# Patient Record
Sex: Male | Born: 1950 | Race: White | Hispanic: No | Marital: Married | Smoking: Current every day smoker
Health system: Southern US, Community
[De-identification: ages and names within clinical notes are randomized; demographics above are authoritative.]

## PROBLEM LIST (undated history)

## (undated) DIAGNOSIS — M87052 Idiopathic aseptic necrosis of left femur: Secondary | ICD-10-CM

## (undated) DIAGNOSIS — E785 Hyperlipidemia, unspecified: Secondary | ICD-10-CM

## (undated) DIAGNOSIS — F329 Major depressive disorder, single episode, unspecified: Secondary | ICD-10-CM

## (undated) DIAGNOSIS — F32A Depression, unspecified: Secondary | ICD-10-CM

## (undated) DIAGNOSIS — G4733 Obstructive sleep apnea (adult) (pediatric): Secondary | ICD-10-CM

## (undated) DIAGNOSIS — J3081 Allergic rhinitis due to animal (cat) (dog) hair and dander: Secondary | ICD-10-CM

## (undated) DIAGNOSIS — M87051 Idiopathic aseptic necrosis of right femur: Secondary | ICD-10-CM

## (undated) DIAGNOSIS — J302 Other seasonal allergic rhinitis: Secondary | ICD-10-CM

## (undated) DIAGNOSIS — M1990 Unspecified osteoarthritis, unspecified site: Secondary | ICD-10-CM

## (undated) DIAGNOSIS — I10 Essential (primary) hypertension: Secondary | ICD-10-CM

## (undated) DIAGNOSIS — I4892 Unspecified atrial flutter: Secondary | ICD-10-CM

## (undated) DIAGNOSIS — J449 Chronic obstructive pulmonary disease, unspecified: Secondary | ICD-10-CM

## (undated) DIAGNOSIS — K219 Gastro-esophageal reflux disease without esophagitis: Secondary | ICD-10-CM

## (undated) HISTORY — DX: Obstructive sleep apnea (adult) (pediatric): G47.33

## (undated) HISTORY — DX: Hyperlipidemia, unspecified: E78.5

## (undated) HISTORY — DX: Allergic rhinitis due to animal (cat) (dog) hair and dander: J30.81

## (undated) HISTORY — DX: Other seasonal allergic rhinitis: J30.2

## (undated) HISTORY — DX: Major depressive disorder, single episode, unspecified: F32.9

## (undated) HISTORY — DX: Depression, unspecified: F32.A

## (undated) HISTORY — PX: TOTAL HIP ARTHROPLASTY: SHX124

## (undated) HISTORY — PX: SINUS SURGERY WITH INSTATRAK: SHX5215

## (undated) HISTORY — PX: APPENDECTOMY: SHX54

## (undated) HISTORY — PX: OTHER SURGICAL HISTORY: SHX169

## (undated) HISTORY — DX: Chronic obstructive pulmonary disease, unspecified: J44.9

## (undated) HISTORY — DX: Unspecified atrial flutter: I48.92

## (undated) HISTORY — PX: TONSILLECTOMY: SUR1361

## (undated) HISTORY — DX: Essential (primary) hypertension: I10

## (undated) HISTORY — DX: Idiopathic aseptic necrosis of left femur: M87.052

## (undated) HISTORY — DX: Gastro-esophageal reflux disease without esophagitis: K21.9

## (undated) HISTORY — DX: Idiopathic aseptic necrosis of left femur: M87.051

## (undated) HISTORY — PX: HERNIA REPAIR: SHX51

---

## 2011-10-12 ENCOUNTER — Other Ambulatory Visit: Payer: Self-pay | Admitting: Physician Assistant

## 2011-10-12 ENCOUNTER — Ambulatory Visit
Admission: RE | Admit: 2011-10-12 | Discharge: 2011-10-12 | Disposition: A | Discharge status: Home / Self Care | Payer: Medicare Other | Source: Ambulatory Visit | Attending: Physician Assistant | Admitting: Physician Assistant

## 2011-10-12 DIAGNOSIS — R05 Cough: Secondary | ICD-10-CM

## 2011-12-02 ENCOUNTER — Ambulatory Visit (INDEPENDENT_AMBULATORY_CARE_PROVIDER_SITE_OTHER): Payer: Medicare Other | Admitting: Critical Care Medicine

## 2011-12-02 ENCOUNTER — Encounter: Payer: Self-pay | Admitting: Critical Care Medicine

## 2011-12-02 VITALS — BP 142/78 | HR 84 | Temp 98.3°F | Ht 72.0 in | Wt 256.0 lb

## 2011-12-02 DIAGNOSIS — J449 Chronic obstructive pulmonary disease, unspecified: Secondary | ICD-10-CM

## 2011-12-02 DIAGNOSIS — E7800 Pure hypercholesterolemia, unspecified: Secondary | ICD-10-CM | POA: Insufficient documentation

## 2011-12-02 MED ORDER — FLUTICASONE-SALMETEROL 250-50 MCG/DOSE IN AEPB: 1.0000 | INHALATION_SPRAY | Freq: Two times a day (BID) | RESPIRATORY_TRACT | Status: DC

## 2011-12-02 NOTE — Progress Notes (Signed)
Subjective:    Patient ID: Miguel [name], male    DOB: [birth year], 61 y.o.   MRN: [id number]  HPI Comments: Dx Copd 1 year.  No smoking in 3 weeks, heavy before. SYmptoms of coughing and dyspnea, started 10/08/11>>got better then exac in March 2013. Pt had low grade fever.     Shortness of Breath This is a chronic problem. The current episode started more than 1 year ago. Episode frequency: exertional now, was at rest when ill. The problem has been gradually improving. Associated symptoms include a fever, headaches, orthopnea, sputum production and wheezing. Pertinent negatives include no abdominal pain, chest pain, claudication, ear pain, hemoptysis, leg pain, leg swelling, PND, rhinorrhea, sore throat, syncope or vomiting. The symptoms are aggravated by smoke, URIs, any activity, exercise and lying flat. Associated symptoms comments: Mucus was yellow now is white. Risk factors include smoking. He has tried beta agonist inhalers, steroid inhalers and oral steroids for the symptoms. The treatment provided moderate relief. His past medical history is significant for allergies, asthma and COPD. There is no history of aspirin allergies, bronchiolitis, CAD, chronic lung disease, DVT, a heart failure, PE or pneumonia.   Past Medical History  Diagnosis Date  . Seasonal allergies   . Cat allergies     and dogs  . COPD (chronic obstructive pulmonary disease)   . High cholesterol      Family History  Problem Relation Age of Onset  . Breast cancer Sister   . Emphysema Mother   . Arthritis Mother   . Diabetes Sister      History   Social History  . Marital Status: Married    Spouse Name: N/A    Number of Children: 2  . 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 Smoking status: Former Smoker -- 1.0 packs/day for 45 years    Types: Cigarettes    Quit date: 11/11/2011  . Smokeless tobacco: Never Used  . Alcohol Use: Yes       beer  . Drug Use: No  . Sexually Active: Not on file   Other Topics Concern  . Not on file   Social History Narrative  . No narrative on file     Allergies  Allergen Reactions  . Aspirin     Sob, nausea, trouble breathing  . Tylenol (Acetaminophen)     Name brand only (causes sob and nausea)     No outpatient prescriptions prior to visit.       Review of Systems  Constitutional: Positive for fever. Negative for fatigue.  HENT: Negative for ear pain, congestion, sore throat, rhinorrhea, sneezing, postnasal drip and sinus pressure.   Respiratory: Positive for cough, sputum production, shortness of breath and wheezing. Negative for hemoptysis, choking and chest tightness.   Cardiovascular: Positive for orthopnea. Negative for chest pain, claudication, leg swelling, syncope and PND.  Gastrointestinal: Negative for vomiting and abdominal pain.  Genitourinary: Negative for difficulty urinating.  Neurological: Positive for headaches. Negative for syncope.       Objective:   Physical Exam Filed Vitals:   12/02/11 1505  BP: 142/78  Pulse: 84  Temp: 98.3 F (36.8 C)  TempSrc: Oral  Height: 6' (1.829 m)  Weight: 116.121 kg (256 lb)  SpO2: 95%    Gen: Pleasant, well-nourished, in no distress,  normal affect  ENT: No lesions,  mouth clear,  oropharynx clear, no postnasal drip  Neck: No JVD, no TMG, no carotid bruits  Lungs: No use of accessory muscles, no dullness to percussion, distant BS  Cardiovascular: RRR, heart sounds normal, no murmur or gallops, no peripheral edema  Abdomen: soft and NT, no HSM,  BS normal  Musculoskeletal: No deformities, no cyanosis or clubbing  Neuro: alert, non focal  Skin: Warm, no lesions or rashes          Assessment & Plan:   COPD (chronic obstructive pulmonary disease) Moderate Copd Gold C with recent exacerbation and ongoing tobacco use Plan Stay off cigarettes Increase advair to one puff twice daily A  referral to pulmonary rehab will be made Return 4 months       Updated Medication List Outpatient Encounter Prescriptions as of 12/02/2011  Medication Sig Dispense Refill  . ACETAMINOPHEN PO Take by mouth as needed.      Marland Kitchen albuterol (PROAIR HFA) 108 (90 BASE) MCG/ACT inhaler Inhale 2 puffs into the lungs every 6 (six) hours as needed.      . DULoxetine (CYMBALTA) 60 MG capsule Take 60 mg by mouth daily.      . Fluticasone-Salmeterol (ADVAIR) 250-50 MCG/DOSE AEPB Inhale 1 puff into the lungs 2 (two) times daily.  60 each  6  . omeprazole (PRILOSEC) 40 MG capsule Take 40 mg by mouth daily.      . pravastatin (PRAVACHOL) 20 MG tablet Take 20 mg by mouth daily.      Marland Kitchen DISCONTD: Fluticasone-Salmeterol (ADVAIR) 250-50 MCG/DOSE AEPB Inhale 1 puff into the lungs daily.       Marland Kitchen DISCONTD: Fluticasone-Salmeterol (ADVAIR) 250-50 MCG/DOSE AEPB Inhale 1 puff into the lungs 2 (two) times daily.  60 each

## 2011-12-02 NOTE — Patient Instructions (Signed)
Stay off cigarettes Increase advair to one puff twice daily A referral to pulmonary rehab will be made Return 4 months

## 2011-12-03 NOTE — Assessment & Plan Note (Signed)
Moderate Copd Gold C with recent exacerbation and ongoing tobacco use Plan Stay off cigarettes Increase advair to one puff twice daily A referral to pulmonary rehab will be made Return 4 months

## 2012-05-04 ENCOUNTER — Ambulatory Visit (INDEPENDENT_AMBULATORY_CARE_PROVIDER_SITE_OTHER): Payer: Medicare Other | Admitting: Critical Care Medicine

## 2012-05-04 ENCOUNTER — Encounter: Payer: Self-pay | Admitting: Critical Care Medicine

## 2012-05-04 VITALS — BP 132/88 | HR 75 | Temp 98.1°F | Ht 72.0 in | Wt 260.0 lb

## 2012-05-04 DIAGNOSIS — Z23 Encounter for immunization: Secondary | ICD-10-CM

## 2012-05-04 DIAGNOSIS — F17200 Nicotine dependence, unspecified, uncomplicated: Secondary | ICD-10-CM

## 2012-05-04 DIAGNOSIS — J449 Chronic obstructive pulmonary disease, unspecified: Secondary | ICD-10-CM

## 2012-05-04 DIAGNOSIS — Z720 Tobacco use: Secondary | ICD-10-CM | POA: Insufficient documentation

## 2012-05-04 NOTE — Progress Notes (Signed)
Subjective:    Patient ID: Miguel [name], male    DOB: Sep 19, 1950, 61 y.o.   MRN: [id number]  HPI Comments:    05/04/2012 Mod Copd C golds , at last ov 3/13: increased Advair to bid. Told to stop smoking Now is better.  Now no cough, pt is less dyspneic unless go a long distance. Smoking: smokes about 3-4 cig/d. Was 1PPD.   CAT score 23 Notes min mucus.  No real dyspnea.   Past Medical History  Diagnosis Date  . Seasonal allergies   . Cat allergies     and dogs  . COPD (chronic obstructive pulmonary disease)   . High cholesterol      Family History  Problem Relation Age of Onset  . Breast cancer Sister   . Emphysema Mother   . Arthritis Mother   . Diabetes Sister      History   Social History  . Marital Status: Married    Spouse Name: N/A    Number of Children: 2  . 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 Smoking status: Current Some Day Smoker -- 1.0 packs/day for 45 years    Types: Cigarettes    Last Attempt to Quit: 11/11/2011  . Smokeless tobacco: Never Used  . Alcohol Use: Yes     beer  . Drug Use: No  . Sexually Active: Not on file   Other Topics Concern  . Not on file   Social History Narrative  . No narrative on file     Allergies  Allergen Reactions  . Aspirin     Sob, nausea, trouble breathing  . Tylenol (Acetaminophen)     Name brand only (causes sob and nausea)     Outpatient Prescriptions Prior to Visit  Medication Sig Dispense Refill  . ACETAMINOPHEN PO Take by mouth as needed.      Marland Kitchen albuterol (PROAIR HFA) 108 (90 BASE) MCG/ACT inhaler Inhale 2 puffs into the lungs every 6 (six) hours as needed.      . DULoxetine (CYMBALTA) 60 MG capsule Take 60 mg by mouth every morning.       . Fluticasone-Salmeterol (ADVAIR) 250-50 MCG/DOSE AEPB Inhale 1 puff into the lungs 2 (two) times daily.  60 each  6  . omeprazole (PRILOSEC) 40 MG capsule Take 40 mg by mouth daily.        . pravastatin (PRAVACHOL) 20 MG tablet Take 20 mg by mouth daily.           Review of Systems  Constitutional: Negative for fatigue.  HENT: Negative for congestion, sneezing, postnasal drip and sinus pressure.   Respiratory: Positive for cough. Negative for choking and chest tightness.   Genitourinary: Negative for difficulty urinating.  Neurological: Negative for syncope.       Objective:   Physical Exam  Filed Vitals:   05/04/12 1340  BP: 132/88  Pulse: 75  Temp: 98.1 F (36.7 C)  TempSrc: Oral  Height: 6' (1.829 m)  Weight: 260 lb (117.935 kg)  SpO2: 95%    Gen: Pleasant, well-nourished, in no distress,  normal affect  ENT: No lesions,  mouth clear,  oropharynx clear, no postnasal drip  Neck: No JVD, no TMG, no carotid bruits  Lungs: No use of accessory muscles, no dullness to percussion, distant BS  Cardiovascular: RRR, heart sounds normal, no murmur or gallops, no peripheral edema  Abdomen: soft and  NT, no HSM,  BS normal  Musculoskeletal: No deformities, no cyanosis or clubbing  Neuro: alert, non focal  Skin: Warm, no lesions or rashes          Assessment & Plan:   COPD (chronic obstructive pulmonary disease) Golds C Copd , stable but still smoking Plan No change in inhaled or maintenance medications. Return in 6 months Focus on smoking cessation using nicorette minis  Pneumovax given today 05/04/2012 Flu vaccine this fall  Tobacco abuse Ongoing tobacco use smoking cessation counseling given    Updated Medication List Outpatient Encounter Prescriptions as of 05/04/2012  Medication Sig Dispense Refill  . ACETAMINOPHEN PO Take by mouth as needed.      Marland Kitchen albuterol (PROAIR HFA) 108 (90 BASE) MCG/ACT inhaler Inhale 2 puffs into the lungs every 6 (six) hours as needed.      . DULoxetine (CYMBALTA) 30 MG capsule Take 30 mg by mouth at bedtime.      . DULoxetine (CYMBALTA) 60 MG capsule Take 60 mg by mouth every morning.       .  Fluticasone-Salmeterol (ADVAIR) 250-50 MCG/DOSE AEPB Inhale 1 puff into the lungs 2 (two) times daily.  60 each  6  . omeprazole (PRILOSEC) 40 MG capsule Take 40 mg by mouth daily.      . simvastatin (ZOCOR) 40 MG tablet Take 40 mg by mouth every evening.      Marland Kitchen DISCONTD: pravastatin (PRAVACHOL) 20 MG tablet Take 20 mg by mouth daily.

## 2012-05-04 NOTE — Addendum Note (Signed)
Addended by: Fenton Foy on: 05/04/2012 02:06 PM   Modules accepted: Orders

## 2012-05-04 NOTE — Assessment & Plan Note (Signed)
Golds C Copd , stable but still smoking Plan No change in inhaled or maintenance medications. Return in 6 months Focus on smoking cessation using nicorette minis  Pneumovax given today 05/04/2012 Flu vaccine this fall

## 2012-05-04 NOTE — Assessment & Plan Note (Signed)
Ongoing tobacco use smoking cessation counseling given

## 2012-08-15 ENCOUNTER — Encounter: Payer: Self-pay | Admitting: Internal Medicine

## 2012-08-17 ENCOUNTER — Other Ambulatory Visit (HOSPITAL_COMMUNITY): Payer: Self-pay | Admitting: Cardiovascular Disease

## 2012-08-17 DIAGNOSIS — I4892 Unspecified atrial flutter: Secondary | ICD-10-CM

## 2012-08-22 ENCOUNTER — Ambulatory Visit (HOSPITAL_COMMUNITY)
Admission: RE | Admit: 2012-08-22 | Discharge: 2012-08-22 | Disposition: A | Discharge status: Home / Self Care | Payer: Medicare Other | Source: Ambulatory Visit | Attending: Cardiovascular Disease | Admitting: Cardiovascular Disease

## 2012-08-22 DIAGNOSIS — E669 Obesity, unspecified: Secondary | ICD-10-CM | POA: Insufficient documentation

## 2012-08-22 DIAGNOSIS — J4489 Other specified chronic obstructive pulmonary disease: Secondary | ICD-10-CM | POA: Insufficient documentation

## 2012-08-22 DIAGNOSIS — J449 Chronic obstructive pulmonary disease, unspecified: Secondary | ICD-10-CM | POA: Insufficient documentation

## 2012-08-22 DIAGNOSIS — F17200 Nicotine dependence, unspecified, uncomplicated: Secondary | ICD-10-CM | POA: Insufficient documentation

## 2012-08-22 DIAGNOSIS — I4892 Unspecified atrial flutter: Secondary | ICD-10-CM

## 2012-08-22 DIAGNOSIS — I517 Cardiomegaly: Secondary | ICD-10-CM | POA: Insufficient documentation

## 2012-08-22 NOTE — Progress Notes (Signed)
[facility]   2D echo completed 08/22/2012.   Cindy Verlinda Slotnick, RDCS   

## 2012-08-25 ENCOUNTER — Encounter: Payer: Self-pay | Admitting: Internal Medicine

## 2012-08-25 ENCOUNTER — Ambulatory Visit (INDEPENDENT_AMBULATORY_CARE_PROVIDER_SITE_OTHER): Payer: Medicare Other | Admitting: Internal Medicine

## 2012-08-25 VITALS — BP 128/68 | HR 95 | Ht 72.0 in | Wt 256.0 lb

## 2012-08-25 DIAGNOSIS — R0609 Other forms of dyspnea: Secondary | ICD-10-CM

## 2012-08-25 DIAGNOSIS — Z720 Tobacco use: Secondary | ICD-10-CM

## 2012-08-25 DIAGNOSIS — I4892 Unspecified atrial flutter: Secondary | ICD-10-CM

## 2012-08-25 DIAGNOSIS — R0683 Snoring: Secondary | ICD-10-CM

## 2012-08-25 DIAGNOSIS — F17200 Nicotine dependence, unspecified, uncomplicated: Secondary | ICD-10-CM

## 2012-08-25 NOTE — Patient Instructions (Addendum)
Your physician has recommended that you have an ablation. Catheter ablation is a medical procedure used to treat some cardiac arrhythmias (irregular heartbeats). During catheter ablation, a long, thin, flexible tube is put into a blood vessel in your groin (upper thigh), or neck. This tube is called an ablation catheter. It is then guided to your heart through the blood vessel. Radio frequency waves destroy small areas of heart tissue where abnormal heartbeats may cause an arrhythmia to start. Please see the instruction sheet given to you today.  Call Tresa Endo once you have surgery for hand scheduled

## 2012-08-25 NOTE — Progress Notes (Signed)
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  He reports that he presented for routine surgery on his hand when preoperative EKG 08/14/12 revealed rate controlled atrial flutter.  His surgery was postponed and he was referred to Miguel [name].  He was found to have relatively asymptomatic atrial flutter.  He reports having "an extra heart beat" for years but does not recall having been diagnosed with atrial fibrillation or atrial flutter.  He feels that his exercise tolerance is stable.  He has chronic bilateral hip issues and is s/p bilateral hip replacement for avascular necrosis.  He snores and his wife notices that he stops breathing at times. Today, he denies symptoms of palpitations, chest pain, shortness of breath, orthopnea, PND, lower extremity edema, dizziness, presyncope, syncope, or neurologic sequela. The patient is tolerating medications without difficulties and is otherwise without complaint today.   Past Medical History  Diagnosis Date  . Seasonal allergies   . Cat allergies     and dogs  . COPD (chronic obstructive pulmonary disease)   . Hyperlipidemia   . Depression   . GERD (gastroesophageal reflux disease)   . Avascular necrosis of bones of both hips     s/p bilateral hip replacement   Past Surgical History  Procedure Date  . Total hip arthroplasty     right and left  . Appendectomy   . Sinus surgery with instatrak     x3  . Hernia repair     x3  . Right hand surgery     Current Outpatient Prescriptions  Medication Sig Dispense Refill  . albuterol (PROAIR HFA) 108 (90 BASE) MCG/ACT inhaler Inhale 2 puffs into the lungs every 6 (six) hours as needed.      Marland Kitchen aspirin 81 MG tablet Take 81 mg by mouth 2 (two) times daily.      . DULoxetine (CYMBALTA) 30 MG capsule Take 30 mg by mouth at bedtime.      . DULoxetine  (CYMBALTA) 60 MG capsule Take 60 mg by mouth every morning.       . Fluticasone-Salmeterol (ADVAIR) 250-50 MCG/DOSE AEPB Inhale 1 puff into the lungs 2 (two) times daily.  60 each  6  . omeprazole (PRILOSEC) 40 MG capsule Take 40 mg by mouth daily.      . simvastatin (ZOCOR) 40 MG tablet Take 40 mg by mouth every evening.        Allergies  Allergen Reactions  . Aspirin     Sob, nausea, trouble breathing  . Tylenol (Acetaminophen)     Name brand only (causes sob and nausea)    History   Social History  . Marital Status: Married    Spouse Name: N/A    Number of Children: 2  . 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 Smoking status: Current Some Day Smoker -- 1.0 packs/day for 45 years    Types: Cigarettes    Last Attempt to Quit: 11/11/2011  . Smokeless tobacco: Never Used     Comment: < 1PPD, trying to quit  . Alcohol Use: Yes     Comment: beer (1-2 cans per day), remote heavy ETOH  . Drug Use: No  . Sexually Active: Not on file   Other Topics Concern  . Not  on file   Social History Narrative   [place] in [place] with spouse and son.  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    Family History  Problem Relation Age of Onset  . Breast cancer Sister   . Emphysema Mother   . Arthritis Mother   . Diabetes Sister     ROS- All systems are reviewed and negative except as per the HPI above  Physical Exam: Filed Vitals:   08/25/12 1542  BP: 128/68  Pulse: 95  Height: 6' (1.829 m)  Weight: 256 lb (116.121 kg)  SpO2: 98%    GEN- The patient is well appearing, alert and oriented x 3 today.   Head- normocephalic, atraumatic Eyes-  Sclera clear, conjunctiva pink Ears- hearing intact Oropharynx- clear Neck- supple, no JVP Lymph- no cervical lymphadenopathy Lungs- Clear to ausculation bilaterally, normal work of breathing Heart- irregular rate and rhythm, no murmurs, rubs or gallops, PMI not laterally  displaced GI- soft, NT, ND, + BS Extremities- no clubbing, cyanosis, or edema MS- no significant deformity or atrophy Skin- no rash or lesion Psych- euthymic mood, full affect Neuro- strength and sensation are intact  EKG today reveals typical appearing atria lflutter, V rate 95 bpm, nonspecific ST/T changes Echo 08/22/12- mild LVH with normal LV function, no significant valvular disease, LA size 39mm  Assessment and Plan:

## 2012-08-27 ENCOUNTER — Encounter: Payer: Self-pay | Admitting: Internal Medicine

## 2012-08-27 DIAGNOSIS — R0683 Snoring: Secondary | ICD-10-CM | POA: Insufficient documentation

## 2012-08-27 DIAGNOSIS — I4892 Unspecified atrial flutter: Secondary | ICD-10-CM | POA: Insufficient documentation

## 2012-08-27 NOTE — Assessment & Plan Note (Signed)
I would advise a sleep study.  This can be performed through Dr Croitoru's office.  I will ask Dr C to arrange the study.

## 2012-08-27 NOTE — Assessment & Plan Note (Signed)
Cessation is strongly advised He is not ready to quit 

## 2012-08-27 NOTE — Assessment & Plan Note (Addendum)
The patient has minimally symptomatic typical appearing atrial flutter.  I have reviewed his record in echart as well as records from Dr [employer] office and do not see any evidence of afib.  Though he has lung disease, his LA size is preserved.  He is minimally symptomatic. I agree with Dr Royann Shivers that atrial flutter ablation is a very good option with reduction in long term stroke risks.  Therapeutic strategies for atrial flutter including medicine and ablation were discussed in detail with the patient today. Risk, benefits, and alternatives to EP study and radiofrequency ablation were also discussed in detail today. These risks include but are not limited to stroke, bleeding, vascular damage, tamponade, perforation, damage to the heart and other structures, AV block requiring pacemaker, worsening renal function, and death. The patient understands these risk and wishes to proceed.   I think that the most prudent option would be for him to proceed with hand surgery which would be a low risk procedure.  Once this has been performed, he will need to be initiated on either pradaxa or xarelto for 4 weeks prior to proceeding with ablation.  He would then require anticoagulation for four weeks following the procedure. He will contact my office once his hand surgery has been scheduled.  He will follow-up with Dr Royann Shivers in the interim.

## 2012-09-05 ENCOUNTER — Telehealth: Payer: Self-pay | Admitting: Internal Medicine

## 2012-09-05 NOTE — Telephone Encounter (Signed)
Dr Renaye Rakers office to do the sleep study

## 2012-09-05 NOTE — Telephone Encounter (Signed)
plz return call to pt wife [phone] regarding sleep apnea test and upcoming hand surgery. Plz return call to pt wife

## 2012-09-22 ENCOUNTER — Encounter (HOSPITAL_BASED_OUTPATIENT_CLINIC_OR_DEPARTMENT_OTHER): Payer: Self-pay | Admitting: *Deleted

## 2012-09-22 NOTE — Progress Notes (Signed)
Pt was to have this surgery 11/13-new Atrial flutter-saw dr croitoru then sent to see dr Johney Frame for possible ablation-dr allred wanted him to have surgery before due to having to go on blood thinners-also needs a sleep study, but will wait until surgery

## 2012-09-22 NOTE — Progress Notes (Signed)
Reviewed with dr hodi-ok since Dr Johney Frame wrote note to do surgery soon so they can start coags.

## 2012-09-27 ENCOUNTER — Encounter (HOSPITAL_BASED_OUTPATIENT_CLINIC_OR_DEPARTMENT_OTHER): Payer: Self-pay | Admitting: Anesthesiology

## 2012-09-27 ENCOUNTER — Encounter (HOSPITAL_BASED_OUTPATIENT_CLINIC_OR_DEPARTMENT_OTHER)
Admission: RE | Disposition: A | Discharge status: Home / Self Care | Payer: Self-pay | Source: Ambulatory Visit | Attending: Orthopedic Surgery

## 2012-09-27 ENCOUNTER — Encounter (HOSPITAL_BASED_OUTPATIENT_CLINIC_OR_DEPARTMENT_OTHER): Payer: Self-pay | Admitting: *Deleted

## 2012-09-27 ENCOUNTER — Ambulatory Visit (HOSPITAL_BASED_OUTPATIENT_CLINIC_OR_DEPARTMENT_OTHER)
Admission: RE | Admit: 2012-09-27 | Discharge: 2012-09-27 | Disposition: A | Discharge status: Home / Self Care | Payer: Medicare Other | Source: Ambulatory Visit | Attending: Orthopedic Surgery | Admitting: Orthopedic Surgery

## 2012-09-27 ENCOUNTER — Ambulatory Visit (HOSPITAL_BASED_OUTPATIENT_CLINIC_OR_DEPARTMENT_OTHER): Payer: Medicare Other | Admitting: Anesthesiology

## 2012-09-27 DIAGNOSIS — M779 Enthesopathy, unspecified: Secondary | ICD-10-CM | POA: Insufficient documentation

## 2012-09-27 DIAGNOSIS — J449 Chronic obstructive pulmonary disease, unspecified: Secondary | ICD-10-CM | POA: Insufficient documentation

## 2012-09-27 DIAGNOSIS — K219 Gastro-esophageal reflux disease without esophagitis: Secondary | ICD-10-CM | POA: Insufficient documentation

## 2012-09-27 DIAGNOSIS — M24139 Other articular cartilage disorders, unspecified wrist: Secondary | ICD-10-CM | POA: Insufficient documentation

## 2012-09-27 DIAGNOSIS — J4489 Other specified chronic obstructive pulmonary disease: Secondary | ICD-10-CM | POA: Insufficient documentation

## 2012-09-27 DIAGNOSIS — S63599A Other specified sprain of unspecified wrist, initial encounter: Secondary | ICD-10-CM

## 2012-09-27 HISTORY — PX: WRIST ARTHROSCOPY WITH DEBRIDEMENT: SHX6194

## 2012-09-27 HISTORY — DX: Unspecified osteoarthritis, unspecified site: M19.90

## 2012-09-27 LAB — POCT HEMOGLOBIN-HEMACUE: Hemoglobin: 17.5 g/dL — ABNORMAL HIGH (ref 13.0–17.0)

## 2012-09-27 SURGERY — WRIST ARTHROSCOPY WITH DEBRIDEMENT
Anesthesia: General | Site: Wrist | Laterality: Left | Wound class: Clean

## 2012-09-27 MED ORDER — ONDANSETRON HCL 4 MG/2ML IJ SOLN: 4.0000 mg | Freq: Once | INTRAMUSCULAR | Status: DC | PRN

## 2012-09-27 MED ORDER — OXYCODONE-ACETAMINOPHEN 10-325 MG PO TABS: 1.0000 | ORAL_TABLET | ORAL | Status: DC | PRN

## 2012-09-27 MED ORDER — FENTANYL CITRATE 0.05 MG/ML IJ SOLN
INTRAMUSCULAR | Status: DC | PRN
  Administered 2012-09-27: 50 ug via INTRAVENOUS

## 2012-09-27 MED ORDER — OXYCODONE HCL 5 MG/5ML PO SOLN: 5.0000 mg | Freq: Once | ORAL | Status: DC | PRN

## 2012-09-27 MED ORDER — LACTATED RINGERS IV SOLN
INTRAVENOUS | Status: DC
  Administered 2012-09-27: 13:00:00 via INTRAVENOUS

## 2012-09-27 MED ORDER — BUPIVACAINE-EPINEPHRINE PF 0.5-1:200000 % IJ SOLN
INTRAMUSCULAR | Status: DC | PRN
  Administered 2012-09-27: 30 mL

## 2012-09-27 MED ORDER — LIDOCAINE HCL (CARDIAC) 20 MG/ML IV SOLN
INTRAVENOUS | Status: DC | PRN
  Administered 2012-09-27: 100 mg via INTRAVENOUS

## 2012-09-27 MED ORDER — HYDROMORPHONE HCL PF 1 MG/ML IJ SOLN
0.2500 mg | INTRAMUSCULAR | Status: DC | PRN
  Administered 2012-09-27: 0.5 mg via INTRAVENOUS

## 2012-09-27 MED ORDER — SODIUM CHLORIDE 0.9 % IR SOLN
Status: DC | PRN
  Administered 2012-09-27: 1000 mL

## 2012-09-27 MED ORDER — MEPERIDINE HCL 25 MG/ML IJ SOLN: 6.2500 mg | INTRAMUSCULAR | Status: DC | PRN

## 2012-09-27 MED ORDER — DEXAMETHASONE SODIUM PHOSPHATE 4 MG/ML IJ SOLN
INTRAMUSCULAR | Status: DC | PRN
  Administered 2012-09-27: 10 mg via INTRAVENOUS

## 2012-09-27 MED ORDER — MIDAZOLAM HCL 2 MG/2ML IJ SOLN
1.0000 mg | INTRAMUSCULAR | Status: DC | PRN
  Administered 2012-09-27: 2 mg via INTRAVENOUS

## 2012-09-27 MED ORDER — CEFAZOLIN SODIUM-DEXTROSE 2-3 GM-% IV SOLR
2.0000 g | Freq: Once | INTRAVENOUS | Status: AC
  Administered 2012-09-27: 2 g via INTRAVENOUS

## 2012-09-27 MED ORDER — DOCUSATE SODIUM 100 MG PO CAPS: 100.0000 mg | ORAL_CAPSULE | Freq: Two times a day (BID) | ORAL | Status: DC

## 2012-09-27 MED ORDER — OXYCODONE HCL 5 MG PO TABS: 5.0000 mg | ORAL_TABLET | Freq: Once | ORAL | Status: DC | PRN

## 2012-09-27 MED ORDER — FENTANYL CITRATE 0.05 MG/ML IJ SOLN
50.0000 ug | INTRAMUSCULAR | Status: DC | PRN
  Administered 2012-09-27: 100 ug via INTRAVENOUS

## 2012-09-27 MED ORDER — PROPOFOL 10 MG/ML IV BOLUS
INTRAVENOUS | Status: DC | PRN
  Administered 2012-09-27: 200 mg via INTRAVENOUS
  Administered 2012-09-27: 20 mg via INTRAVENOUS

## 2012-09-27 SURGICAL SUPPLY — 75 items
BANDAGE CONFORM 3  STR LF (GAUZE/BANDAGES/DRESSINGS) ×2 IMPLANT
BANDAGE ELASTIC 3 VELCRO ST LF (GAUZE/BANDAGES/DRESSINGS) ×4 IMPLANT
BANDAGE ELASTIC 4 VELCRO ST LF (GAUZE/BANDAGES/DRESSINGS) IMPLANT
BANDAGE GAUZE ELAST BULKY 4 IN (GAUZE/BANDAGES/DRESSINGS) ×2 IMPLANT
BLADE CUDA 2.0 (BLADE) ×2 IMPLANT
BLADE SURG 11 STRL SS (BLADE) ×2 IMPLANT
BLADE SURG 15 STRL LF DISP TIS (BLADE) ×1 IMPLANT
BLADE SURG 15 STRL SS (BLADE) ×1
BNDG COHESIVE 3X5 TAN STRL LF (GAUZE/BANDAGES/DRESSINGS) IMPLANT
BRUSH SCRUB EZ PLAIN DRY (MISCELLANEOUS) IMPLANT
CORDS BIPOLAR (ELECTRODE) ×2 IMPLANT
COVER MAYO STAND STRL (DRAPES) ×2 IMPLANT
COVER TABLE BACK 60X90 (DRAPES) ×2 IMPLANT
CUFF TOURNIQUET SINGLE 18IN (TOURNIQUET CUFF) ×2 IMPLANT
DECANTER SPIKE VIAL GLASS SM (MISCELLANEOUS) IMPLANT
DRAPE EXTREMITY T 121X128X90 (DRAPE) ×2 IMPLANT
DRAPE OEC MINIVIEW 54X84 (DRAPES) IMPLANT
DRAPE SURG 17X23 STRL (DRAPES) ×2 IMPLANT
DRSG EMULSION OIL 3X3 NADH (GAUZE/BANDAGES/DRESSINGS) IMPLANT
GAUZE SPONGE 4X4 16PLY XRAY LF (GAUZE/BANDAGES/DRESSINGS) IMPLANT
GAUZE XEROFORM 1X8 LF (GAUZE/BANDAGES/DRESSINGS) ×2 IMPLANT
GLOVE BIO SURGEON STRL SZ 6.5 (GLOVE) ×2 IMPLANT
GLOVE BIO SURGEON STRL SZ7.5 (GLOVE) ×4 IMPLANT
GLOVE BIO SURGEON STRL SZ8 (GLOVE) ×2 IMPLANT
GLOVE BIOGEL PI IND STRL 8 (GLOVE) ×1 IMPLANT
GLOVE BIOGEL PI INDICATOR 8 (GLOVE) ×1
GOWN BRE IMP PREV XXLGXLNG (GOWN DISPOSABLE) ×6 IMPLANT
GOWN PREVENTION PLUS XLARGE (GOWN DISPOSABLE) IMPLANT
GOWN PREVENTION PLUS XXLARGE (GOWN DISPOSABLE) ×2 IMPLANT
NDL SAFETY ECLIPSE 18X1.5 (NEEDLE) ×1 IMPLANT
NDL SUT 6 .5 CRC .975X.05 MAYO (NEEDLE) IMPLANT
NEEDLE HYPO 18GX1.5 SHARP (NEEDLE) ×1
NEEDLE HYPO 22GX1.5 SAFETY (NEEDLE) ×4 IMPLANT
NEEDLE HYPO 25X1 1.5 SAFETY (NEEDLE) IMPLANT
NEEDLE MAYO 6 CRC TAPER PT (NEEDLE) IMPLANT
NEEDLE MAYO TAPER (NEEDLE)
NS IRRIG 1000ML POUR BTL (IV SOLUTION) IMPLANT
PACK BASIN DAY SURGERY FS (CUSTOM PROCEDURE TRAY) ×2 IMPLANT
PAD CAST 3X4 CTTN HI CHSV (CAST SUPPLIES) ×2 IMPLANT
PAD CAST 4YDX4 CTTN HI CHSV (CAST SUPPLIES) IMPLANT
PADDING CAST ABS 4INX4YD NS (CAST SUPPLIES) ×1
PADDING CAST ABS COTTON 4X4 ST (CAST SUPPLIES) ×1 IMPLANT
PADDING CAST COTTON 3X4 STRL (CAST SUPPLIES) ×2
PADDING CAST COTTON 4X4 STRL (CAST SUPPLIES)
PASSER SUT SWANSON 36MM LOOP (INSTRUMENTS) IMPLANT
SET SM JOINT TUBING/CANN (CANNULA) ×2 IMPLANT
SLING ARM FOAM STRAP MED (SOFTGOODS) IMPLANT
SPLINT FAST PLASTER 5X30 (CAST SUPPLIES)
SPLINT FIBERGLASS 3X35 (CAST SUPPLIES) IMPLANT
SPLINT FIBERGLASS 4X30 (CAST SUPPLIES) ×2 IMPLANT
SPLINT PLASTER CAST FAST 5X30 (CAST SUPPLIES) IMPLANT
SPLINT PLASTER CAST XFAST 3X15 (CAST SUPPLIES) IMPLANT
SPLINT PLASTER XTRA FASTSET 3X (CAST SUPPLIES)
STOCKINETTE 4X48 STRL (DRAPES) ×2 IMPLANT
STOCKINETTE SYNTHETIC 3 UNSTER (CAST SUPPLIES) IMPLANT
STRIP CLOSURE SKIN 1/2X4 (GAUZE/BANDAGES/DRESSINGS) IMPLANT
SUT ETHIBOND 3-0 V-5 (SUTURE) IMPLANT
SUT PDS 2 CP NEEDLE XSPECIAL (SUTURE) IMPLANT
SUT PDS AB 2-0 CT2 27 (SUTURE) IMPLANT
SUT PROLENE 3 0 PS 2 (SUTURE) IMPLANT
SUT PROLENE 4 0 PS 2 18 (SUTURE) ×2 IMPLANT
SUT VIC AB 2-0 PS2 27 (SUTURE) ×4 IMPLANT
SUT VIC AB 3-0 FS2 27 (SUTURE) IMPLANT
SUT VICRYL 4-0 PS2 18IN ABS (SUTURE) ×2 IMPLANT
SYR BULB 3OZ (MISCELLANEOUS) IMPLANT
SYR CONTROL 10ML LL (SYRINGE) ×2 IMPLANT
TAPE SURG TRANSPORE 1 IN (GAUZE/BANDAGES/DRESSINGS) IMPLANT
TAPE SURGICAL TRANSPORE 1 IN (GAUZE/BANDAGES/DRESSINGS)
TOWEL OR 17X24 6PK STRL BLUE (TOWEL DISPOSABLE) ×2 IMPLANT
TRAP DIGIT (INSTRUMENTS) IMPLANT
TRAP FINGER LRG (INSTRUMENTS) ×2 IMPLANT
TUBE CONNECTING 20X1/4 (TUBING) ×2 IMPLANT
UNDERPAD 30X30 INCONTINENT (UNDERPADS AND DIAPERS) ×2 IMPLANT
WAND 1.5 MICROBLATOR (SURGICAL WAND) ×2 IMPLANT
WATER STERILE IRR 1000ML POUR (IV SOLUTION) ×2 IMPLANT

## 2012-09-27 NOTE — H&P (Signed)
Miguel [name] is an 62 y.o. male.   Chief Complaint: LEFT WRIST ULNAR SIDED WRIST PAIN PT ELECTS SURGERY HPI: PT WITH INJURY TO LEFT WRIST PT HERE FOR SURGERY ON LEFT WRIST  PT FOLLOWED IN OFFICE AND HAS NOT RESPONDED TO NON OPERATIVE MANAGEMENT PT HAS HAD INJECTIONS, BRACING MEDICATION AND CONTINUES TO HAVE PAIN ON ULNAR ASPECT OF WRIST.  Past Medical History  Diagnosis Date  . Seasonal allergies   . Cat allergies     and dogs  . COPD (chronic obstructive pulmonary disease)   . Hyperlipidemia   . Depression   . GERD (gastroesophageal reflux disease)   . Avascular necrosis of bones of both hips     s/p bilateral hip replacement  . Sleep apnea     sch for sleep spudy after hand surg  . Arthritis   . Dysrhythmia     atrial flutter  . Complication of anesthesia     during one hip-too much meds-resp arrest-    Past Surgical History  Procedure Date  . Total hip arthroplasty     right and left  . Appendectomy   . Sinus surgery with instatrak     x3  . Hernia repair     x3  . Right hand surgery   . Tonsillectomy     Family History  Problem Relation Age of Onset  . Breast cancer Sister   . Emphysema Mother   . Arthritis Mother   . Diabetes Sister    Social History:  reports that he has been smoking Cigarettes.  He has a 45 pack-year smoking history. He has never used smokeless tobacco. He reports that he drinks alcohol. He reports that he does not use illicit drugs.  Allergies:  Allergies  Allergen Reactions  . Aspirin     Sob, nausea, trouble breathing  . Tylenol (Acetaminophen)     Name brand only (causes sob and nausea)    Medications Prior to Admission  Medication Sig Dispense Refill  . albuterol (PROAIR HFA) 108 (90 BASE) MCG/ACT inhaler Inhale 2 puffs into the lungs every 6 (six) hours as needed.      Marland Kitchen aspirin 81 MG tablet Take 81 mg by mouth 2 (two) times daily.      . cetirizine (ZYRTEC) 10 MG tablet Take 10 mg by mouth as needed.      . DULoxetine  (CYMBALTA) 30 MG capsule Take 30 mg by mouth at bedtime.      . DULoxetine (CYMBALTA) 60 MG capsule Take 60 mg by mouth every morning.       . Fluticasone-Salmeterol (ADVAIR) 250-50 MCG/DOSE AEPB Inhale 1 puff into the lungs 2 (two) times daily.  60 each  6  . HYDROcodone-acetaminophen (NORCO) 10-325 MG per tablet Take 1 tablet by mouth every 6 (six) hours as needed.      Marland Kitchen omeprazole (PRILOSEC) 40 MG capsule Take 40 mg by mouth daily.      . simvastatin (ZOCOR) 40 MG tablet Take 40 mg by mouth every evening.        Results for orders placed during the hospital encounter of 09/27/12 (from the past 48 hour(s))  POCT HEMOGLOBIN-HEMACUE     Status: Abnormal   Collection Time   09/27/12  1:01 PM      Component Value Range Comment   Hemoglobin 17.5 (*) 13.0 - 17.0 g/dL    No results found.  Blood pressure 124/85, pulse 121, temperature 98.3 F (36.8 C), temperature source Oral, resp. rate  15, height 6' (1.829 m), weight 114.76 kg (253 lb), SpO2 100.00%. General Appearance:  Alert, cooperative, no distress, appears stated age  Head:  Normocephalic, without obvious abnormality, atraumatic  Eyes:  Pupils equal, conjunctiva/corneas clear,         Throat: Lips, mucosa, and tongue normal; teeth and gums normal  Neck: No visible masses     Lungs:   respirations unlabored  Chest Wall:  No tenderness or deformity  Heart:  Regular rate and rhythm,  Abdomen:   Soft, non-tender,         Extremities: Left wrist: block in place fingers warm well perfused No open wounds   Pulses: 2+ and symmetric  Skin: Skin color, texture, turgor normal, no rashes or lesions     Neurologic: Normal    Assessment/Plan Left wrist injury with persistent ulnar sided wrist pain, central tfcc tear and partial tendon splitting of ecu  Left wrist arthroscopy and debridement and or repair and sixth dorsal compartment tendon exploration and or repair  R/B/A DISCUSSED WITH PT IN OFFICE.  PT VOICED UNDERSTANDING OF  PLAN CONSENT SIGNED DAY OF SURGERY PT SEEN AND EXAMINED PRIOR TO OPERATIVE PROCEDURE/DAY OF SURGERY SITE MARKED. QUESTIONS ANSWERED WILL GO HOME FOLLOWING SURGERY  Sharma Covert 09/27/2012, 1:53 PM

## 2012-09-27 NOTE — Anesthesia Procedure Notes (Addendum)
Anesthesia Regional Block:  Supraclavicular block  Pre-Anesthetic Checklist: ,, timeout performed, Correct Patient, Correct Site, Correct Laterality, Correct Procedure, Correct Position, site marked, Risks and benefits discussed,  Surgical consent,  Pre-op evaluation,  At surgeon's request and post-op pain management  Laterality: Left  Prep: chloraprep       Needles:  Injection technique: Single-shot  Needle Type: Echogenic Stimulator Needle     Needle Length: 5cm 5 cm     Additional Needles:  Procedures: ultrasound guided (picture in chart) and nerve stimulator Supraclavicular block  Nerve Stimulator or Paresthesia:  Response: 0.4 mA,   Additional Responses:   Narrative:  Start time: 09/27/2012 1:14 PM End time: 09/27/2012 1:21 PM Injection made incrementally with aspirations every 5 mL.  Performed by: Personally  Anesthesiologist: Arta Bruce MD  Additional Notes: Monitors applied. Patient sedated. Sterile prep and drape,hand hygiene and sterile gloves were used. Relevant anatomy identified.Needle position confirmed.Local anesthetic injected incrementally after negative aspiration. Local anesthetic spread visualized around nerve(s). Vascular puncture avoided. No complications. Image printed for medical record.The patient tolerated the procedure well.       Supraclavicular block Procedure Name: LMA Insertion Date/Time: 09/27/2012 2:29 PM Performed by: Renella Cunas D Pre-anesthesia Checklist: Patient identified, Emergency Drugs available, Suction available and Patient being monitored Patient Re-evaluated:Patient Re-evaluated prior to inductionOxygen Delivery Method: Circle System Utilized Preoxygenation: Pre-oxygenation with 100% oxygen Intubation Type: IV induction Ventilation: Mask ventilation without difficulty LMA: LMA inserted LMA Size: 5.0 Number of attempts: 1 Airway Equipment and Method: bite block Placement Confirmation: positive ETCO2 Tube secured with:  Tape Dental Injury: Teeth and Oropharynx as per pre-operative assessment

## 2012-09-27 NOTE — Anesthesia Postprocedure Evaluation (Signed)
Anesthesia Post Note  Patient: Miguel [name]  Procedure(s) Performed: Procedure(s) (LRB): WRIST ARTHROSCOPY WITH DEBRIDEMENT (Left)  Anesthesia type: general  Patient location: PACU  Post pain: Pain level controlled  Post assessment: Patient's Cardiovascular Status Stable  Last Vitals:  Filed Vitals:   09/27/12 1645  BP: 124/85  Pulse: 123  Temp:   Resp: 13    Post vital signs: Reviewed and stable  Level of consciousness: sedated  Complications: No apparent anesthesia complications

## 2012-09-27 NOTE — Transfer of Care (Signed)
Immediate Anesthesia Transfer of Care Note  Patient: Miguel [name]  Procedure(s) Performed: Procedure(s) (LRB) with comments: WRIST ARTHROSCOPY WITH DEBRIDEMENT (Left) - Left Wrist Arthroscopy and Debridement of Triangular Cartilege Complex Tear and Lunotriquetral ligament,  Exploration of Sixth Dorsal Compartment and Repair  Patient Location: PACU  Anesthesia Type:General  Level of Consciousness: awake  Airway & Oxygen Therapy: Patient Spontanous Breathing and Patient connected to face mask oxygen  Post-op Assessment: Report given to PACU RN and Post -op Vital signs reviewed and stable  Post vital signs: Reviewed and stable  Complications: No apparent anesthesia complications

## 2012-09-27 NOTE — Anesthesia Preprocedure Evaluation (Signed)
Anesthesia Evaluation  Patient identified by MRN, date of birth, ID band Patient awake    Reviewed: Allergy & Precautions, H&P , NPO status , Patient's Chart, lab work & pertinent test results  Airway Mallampati: II TM Distance: >3 FB Neck ROM: Full    Dental   Pulmonary COPD         Cardiovascular + dysrhythmias Atrial Fibrillation     Neuro/Psych    GI/Hepatic GERD-  Medicated and Controlled,  Endo/Other    Renal/GU      Musculoskeletal   Abdominal   Peds  Hematology   Anesthesia Other Findings   Reproductive/Obstetrics                           Anesthesia Physical Anesthesia Plan  ASA: III  Anesthesia Plan: General   Post-op Pain Management:    Induction: Intravenous  Airway Management Planned: LMA  Additional Equipment:   Intra-op Plan:   Post-operative Plan: Extubation in OR  Informed Consent: I have reviewed the patients History and Physical, chart, labs and discussed the procedure including the risks, benefits and alternatives for the proposed anesthesia with the patient or authorized representative who has indicated his/her understanding and acceptance.     Plan Discussed with: CRNA and Surgeon  Anesthesia Plan Comments: (Dr Allred cleared for hand sx before getting Ablation for A flutter. Plan: Supraclavicular block and GA with LMA)        Anesthesia Quick Evaluation

## 2012-09-27 NOTE — Progress Notes (Signed)
Assisted Dr. Ossey with left, ultrasound guided, supraclavicular block. Side rails up, monitors on throughout procedure. See vital signs in flow sheet. Tolerated Procedure well. 

## 2012-09-27 NOTE — Brief Op Note (Signed)
09/27/2012  2:21 PM  PATIENT:  Miguel [name]  62 y.o. male  PRE-OPERATIVE DIAGNOSIS:  Left Wrist TFCC Tear  POST-OPERATIVE DIAGNOSIS:  Left Wrist TFCC Tear  PROCEDURE:  Procedure(s) (LRB) with comments: WRIST ARTHROSCOPY WITH DEBRIDEMENT (Left) - Left Wrist Arthroscopy and Debridement and Exploration of Sixth Dorsal Compartment and Repair  SURGEON:  Surgeon(s) and Role:    * Sharma Covert, MD - Primary  PHYSICIAN ASSISTANT:   ASSISTANTS: none   ANESTHESIA:   general  EBL:   minimal  BLOOD ADMINISTERED:none  DRAINS: none   LOCAL MEDICATIONS USED:  NONE  SPECIMEN:  No Specimen  DISPOSITION OF SPECIMEN:  N/A  COUNTS:  YES  TOURNIQUET:  25 min DICTATION: .[id number]  PLAN OF CARE: Discharge to home after PACU  PATIENT DISPOSITION:  PACU - hemodynamically stable.   Delay start of Pharmacological VTE agent (>24hrs) due to surgical blood loss or risk of bleeding: not applicable

## 2012-09-28 ENCOUNTER — Encounter (HOSPITAL_BASED_OUTPATIENT_CLINIC_OR_DEPARTMENT_OTHER): Payer: Self-pay | Admitting: Orthopedic Surgery

## 2012-09-28 NOTE — Op Note (Signed)
NAME:  Miguel [name], Miguel [name]              ACCOUNT NO.:  [id number]  MEDICAL RECORD NO.:  [id number]  LOCATION:  SECV                         FACILITY:  [facility]  PHYSICIAN:  Janiya Millirons W Quayshawn Nin IV, MD  DATE OF BIRTH:  09/27/1950  DATE OF PROCEDURE:  09/27/2012 DATE OF DISCHARGE:  08/22/2012                              OPERATIVE REPORT   PREOPERATIVE DIAGNOSIS:  Left wrist central triangular fibrocartilaginous complex tear, and a 6 dorsal compartment tendinopathy.  POSTOPERATIVE DIAGNOSIS:  Left wrist central triangular fibrocartilaginous complex tear, and a 6 dorsal compartment tendinopathy.  Left wrist lunotriquetral ligament tear, and cartilaginous loss is on the lunate and triquetrum.  ANESTHESIA:  General via supraclavicular block in addition to general anesthesia via LMA.  TOURNIQUET TIME:  Was an half an hour at 250 mmHg.  SURGICAL PROCEDURE: 1. Left wrist diagnostic arthroscopy and debridement of partial     ligament tear and debridement of chondral fragments, lunate and     triquetrum. 2. Left wrist partial and debridement of central TFCC and fraying. 3. Left wrist sixth dorsal compartment exploration and tendon     debridement.  SURGICAL INDICATIONS:  Miguel [name] is a right-hand dominant gentleman who was having persistent ulnar-sided wrist pain.  The patient had failed nonsurgical treatment and elected to undergo the above procedure. Risks, benefits, and alternatives were discussed in detail with the patient and signed informed consent was obtained.  Risks include but not limited to bleeding, infection, damage to nearby nerves, arteries, or tendon, loss motion of the wrist and digits, incomplete relief of symptoms, and need for further surgical intervention.  DESCRIPTION OF PROCEDURE:  The patient was properly identified in the preop holding area and mark with a permanent marker made on left wrist to indicate the correct operative site.  The patient was then brought back to  the operating room, placed supine on the anesthesia room table. General anesthesia was administered.  The patient had previously undergone supraclavicular block.  A well-padded tourniquet was then placed on the left brachium, sealed with 1000 drape.  The left upper extremity was then prepped and draped in normal sterile fashion.  Time- out was called, the correct side was identified, and procedure was then begun.  Attention was then turned to the left hand, wrist and the limb was then placed on the right wrist arthroscopy tower.  Counter traction was then applied.  Anatomical portal sites were then marked out on the wrist.  Following this, 10 mL of saline solution were then injected into the joint.  The 3-4 working portal was then established with a small scalpel.  The joint was then entered and a __________ the wrist __________ arthroscopy was then carried out.  Outflow portal was established with an 18-gauge needle ulnarly.  Upon inspection of the joint,  there was some mild fraying of the SL ligament.  The radioscaphoid, capitate, short and long radiolunate ligaments were in continuity.  Further inspection was noted only where a large bulk of the pathology was noted.  The patient did have a partial surface fraying of the central region of TFCC as well as LT ligament tear with loose chondral fragments.  Following this, the portal site was   then entered into the joint and the small arthroscopic suction shaver was then introduced.  Using a small __________ shaver, debridement was then carried out of the loose chondral fragments, and partial fraying of the TFCC.  Debridement was then carried out of both the lunate and triquetrum.  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 Following this,  instrumentation was then reversed and final  debridement was then carried out of the dorsal portion of the LT ligament.  The patient was then able to pass the scope.  There was no significant diastasis between the LT band, but the chondral changes noted were the predominant findings.  After debridement of the partial LT tear and the chondral surfaces, the final debridement was carried out with the TFCC with the suction shaver.  The wound was then thoroughly irrigated.  The instrumentation was then removed.  The limb was then elevated using Esmarch exsanguination and the tourniquet insufflated.  The wrist was taken out of the wrist traction tower.  Longitudinal incision made directly over the sixth dorsal compartment.  Dissection was then carried out through the skin and subcutaneous tissue.  The retinaculum and the 6th dorsal compartment was then entered.  The subsheath was incontinuity.  The subsheath opened.  Sixth dorsal compartment did have a longitudinal split with fraying of the tendon and tendon debridement was then carried out taking it back to a stable rim.  After debridement of the tendon, the wound was then irrigated.  No other abnormalities were noted within sixth dorsal compartment.  Subsheath was repaired with 2-0 Vicryl.  The retinaculum was repaired with 2-0 Vicryl.  Subcutaneous tissues closed with 4-0 Vicryl and the skin closed with running simple 4- 0 Prolene.  Xeroform dressing and sterile compressive bandage was then applied.  The patient was then placed in a short-arm volar splint, extubated, and taken to recovery room in good condition.  POSTPROCEDURE PLAN:  The patient was discharged to home, seen back in the office in approximately 2 weeks for wound check, suture removal, application of short-arm cast for a total of 2 more weeks and then begin therapy regimen at the 4-week mark.  Guarded prognosis given the chondral injuries to the lunate triquetrum which I think were probably the main contributors of his  pain.     Miguel [name] W Miguel [name] IV, MD     FWO/MEDQ  D:  09/27/2012  T:  09/28/2012  Job:  [id number] 

## 2012-09-28 NOTE — Op Note (Deleted)
NAMESALADIN, [name]              ACCOUNT NO.:  [id number]  MEDICAL RECORD NO.:  [id number]  LOCATION:  SECV                         FACILITY:  [facility]  PHYSICIAN:  Miguel Done, MD  DATE OF BIRTH:  27-Jul-1951  DATE OF PROCEDURE:  09/27/2012 DATE OF DISCHARGE:  08/22/2012                              OPERATIVE REPORT   PREOPERATIVE DIAGNOSIS:  Left wrist central triangular fibrocartilaginous complex tear, and a 6 dorsal compartment tendinopathy.  POSTOPERATIVE DIAGNOSIS:  Left wrist central triangular fibrocartilaginous complex tear, and a 6 dorsal compartment tendinopathy.  Left wrist lunotriquetral ligament tear, and cartilaginous loss is on the lunate and triquetrum.  ANESTHESIA:  General via supraclavicular block in addition to general anesthesia via LMA.  TOURNIQUET TIME:  Was an half an hour at 250 mmHg.  SURGICAL PROCEDURE: 1. Left wrist diagnostic arthroscopy and debridement of partial     ligament tear and debridement of chondral fragments, lunate and     triquetrum. 2. Left wrist partial and debridement of central TFCC and fraying. 3. Left wrist sixth dorsal compartment exploration and tendon     debridement.  SURGICAL INDICATIONS:  Miguel [name] is a right-hand dominant gentleman who was having persistent ulnar-sided wrist pain.  The patient had failed nonsurgical treatment and elected to undergo the above procedure. Risks, benefits, and alternatives were discussed in detail with the patient and signed informed consent was obtained.  Risks include but not limited to bleeding, infection, damage to nearby nerves, arteries, or tendon, loss motion of the wrist and digits, incomplete relief of symptoms, and need for further surgical intervention.  DESCRIPTION OF PROCEDURE:  The patient was properly identified in the preop holding area and mark with a permanent marker made on left wrist to indicate the correct operative site.  The patient was then brought back to  the operating room, placed supine on the anesthesia room table. General anesthesia was administered.  The patient had previously undergone supraclavicular block.  A well-padded tourniquet was then placed on the left brachium, sealed with 1000 drape.  The left upper extremity was then prepped and draped in normal sterile fashion.  Time- out was called, the correct side was identified, and procedure was then begun.  Attention was then turned to the left hand, wrist and the limb was then placed on the right wrist arthroscopy tower.  Counter traction was then applied.  Anatomical portal sites were then marked out on the wrist.  Following this, 10 mL of saline solution were then injected into the joint.  The 3-4 working portal was then established with a small scalpel.  The joint was then entered and a __________ the wrist __________ arthroscopy was then carried out.  Outflow portal was established with an 18-gauge needle ulnarly.  Upon inspection of the joint,  there was some mild fraying of the SL ligament.  The radioscaphoid, capitate, short and long radiolunate ligaments were in continuity.  Further inspection was noted only where a large bulk of the pathology was noted.  The patient did have a partial surface fraying of the central region of TFCC as well as LT ligament tear with loose chondral fragments.  Following this, the portal site was  then entered into the joint and the small arthroscopic suction shaver was then introduced.  Using a small __________ shaver, debridement was then carried out of the loose chondral fragments, and partial fraying of the TFCC.  Debridement was then carried out of both the lunate and triquetrum.  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 Following this,  instrumentation was then reversed and final  debridement was then carried out of the dorsal portion of the LT ligament.  The patient was then able to pass the scope.  There was no significant diastasis between the LT band, but the chondral changes noted were the predominant findings.  After debridement of the partial LT tear and the chondral surfaces, the final debridement was carried out with the TFCC with the suction shaver.  The wound was then thoroughly irrigated.  The instrumentation was then removed.  The limb was then elevated using Esmarch exsanguination and the tourniquet insufflated.  The wrist was taken out of the wrist traction tower.  Longitudinal incision made directly over the sixth dorsal compartment.  Dissection was then carried out through the skin and subcutaneous tissue.  The retinaculum and the 6th dorsal compartment was then entered.  The subsheath was incontinuity.  The subsheath opened.  Sixth dorsal compartment did have a longitudinal split with fraying of the tendon and tendon debridement was then carried out taking it back to a stable rim.  After debridement of the tendon, the wound was then irrigated.  No other abnormalities were noted within sixth dorsal compartment.  Subsheath was repaired with 2-0 Vicryl.  The retinaculum was repaired with 2-0 Vicryl.  Subcutaneous tissues closed with 4-0 Vicryl and the skin closed with running simple 4- 0 Prolene.  Xeroform dressing and sterile compressive bandage was then applied.  The patient was then placed in a short-arm volar splint, extubated, and taken to recovery room in good condition.  POSTPROCEDURE PLAN:  The patient was discharged to home, seen back in the office in approximately 2 weeks for wound check, suture removal, application of short-arm cast for a total of 2 more weeks and then begin therapy regimen at the 4-week mark.  Guarded prognosis given the chondral injuries to the lunate triquetrum which I think were probably the main contributors of his  pain.     Miguel Done, MD     FWO/MEDQ  D:  09/27/2012  T:  09/28/2012  Job:  [id number]

## 2012-09-29 ENCOUNTER — Telehealth: Payer: Self-pay | Admitting: Internal Medicine

## 2012-09-29 NOTE — Telephone Encounter (Signed)
Spoke with patient  He is going to follow up with the surgeon on 10/12/12.  He will start Xarelto on 10/13/12 and plan for the ablation the second week in April  He will call me after seeing the surgeon back

## 2012-09-29 NOTE — Telephone Encounter (Signed)
Pt to call after hand surgery to discuss blood thinner, pls call

## 2012-10-09 ENCOUNTER — Telehealth: Payer: Self-pay | Admitting: Critical Care Medicine

## 2012-10-09 NOTE — Telephone Encounter (Signed)
Called pt x 3 to make next appt per recall.  Left 3 messages and pt never called back.  Mailed recall letter 10/09/12. Miguel [name]  °

## 2012-10-13 ENCOUNTER — Encounter: Payer: Self-pay | Admitting: *Deleted

## 2012-10-13 ENCOUNTER — Other Ambulatory Visit: Payer: Self-pay | Admitting: *Deleted

## 2012-10-13 ENCOUNTER — Telehealth: Payer: Self-pay | Admitting: Internal Medicine

## 2012-10-13 DIAGNOSIS — I4892 Unspecified atrial flutter: Secondary | ICD-10-CM

## 2012-10-13 MED ORDER — RIVAROXABAN 20 MG PO TABS: 20.0000 mg | ORAL_TABLET | Freq: Every day | ORAL | Status: DC

## 2012-10-13 NOTE — Telephone Encounter (Signed)
Scheduled for flutter ablation  Xarelto 20mg  samples out front for patient to take one daily

## 2012-10-13 NOTE — Telephone Encounter (Signed)
New problem     Saw surgeon on yesterday was advise to start his blood thinner. S/p hand surgery about  2 week ago.

## 2012-10-30 ENCOUNTER — Ambulatory Visit (INDEPENDENT_AMBULATORY_CARE_PROVIDER_SITE_OTHER): Payer: Medicare Other | Admitting: Critical Care Medicine

## 2012-10-30 ENCOUNTER — Encounter: Payer: Self-pay | Admitting: Critical Care Medicine

## 2012-10-30 VITALS — BP 110/70 | HR 62 | Temp 97.9°F | Ht 72.0 in | Wt 256.0 lb

## 2012-10-30 DIAGNOSIS — F17200 Nicotine dependence, unspecified, uncomplicated: Secondary | ICD-10-CM

## 2012-10-30 DIAGNOSIS — Z720 Tobacco use: Secondary | ICD-10-CM

## 2012-10-30 DIAGNOSIS — J449 Chronic obstructive pulmonary disease, unspecified: Secondary | ICD-10-CM

## 2012-10-30 NOTE — Assessment & Plan Note (Signed)
Gold C copd with ongoing tobacco use Probable OSA Plan Get sleep study done Stay on advair Focus on smoking cessation with nicoderm 21mg  patch plus 4mg  nicorette minis to supplement about 3-4 times daily Return 3 months

## 2012-10-30 NOTE — Patient Instructions (Signed)
Get sleep study done Stay on advair Focus on smoking cessation with nicoderm 21mg  patch plus 4mg  nicorette minis to supplement about 3-4 times daily Return 3 months

## 2012-10-30 NOTE — Assessment & Plan Note (Signed)
Pt counseled on smoking cessation, spent 3-48min on same

## 2012-10-30 NOTE — Progress Notes (Signed)
Subjective:    Patient ID: Miguel [name], male    DOB: [birth year], 62 y.o.   MRN: [id number]  HPI Comments:    05/04/2012 Mod Copd C golds , at last ov 3/13: increased Advair to bid. Told to stop smoking Now is better.  Now no cough, pt is less dyspneic unless go a long distance. Smoking: smokes about 3-4 cig/d. Was 1PPD.   CAT score 23 Notes min mucus.  No real dyspnea.  10/30/2012 Still smoking 1/2PPD.  Needs an ablation, at [facility]. For atrial flutter.  Allred. Now min cough, notes some wheeze.  On advair twice. On xarelto for anticoagulation.  March 11 is ablation date.  No real chest pain.  NO edeam in feet. Awakens  at night due to pain issues. No oxygen.  Pt to have sleep study to be done march 19 per Dr Jomarie Longs   Past Medical History  Diagnosis Date  . Seasonal allergies   . Cat allergies     and dogs  . COPD (chronic obstructive pulmonary disease)   . Hyperlipidemia   . Depression   . GERD (gastroesophageal reflux disease)   . Avascular necrosis of bones of both hips     s/p bilateral hip replacement  . Sleep apnea     sch for sleep spudy after hand surg  . Arthritis   . Dysrhythmia     atrial flutter  . Complication of anesthesia     during one hip-too much meds-resp arrest-     Family History  Problem Relation Age of Onset  . Breast cancer Sister   . Emphysema Mother   . Arthritis Mother   . Diabetes Sister      History   Social History  . Marital Status: Married    Spouse Name: N/A    Number of Children: 2  . 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 Smoking status: Current Every Day Smoker -- 0.50 packs/day    Types: Cigarettes  . Smokeless tobacco: Never Used     Comment: Started smoking at age 75.  Currently smoking 1/2 ppd.  . Alcohol Use: Yes     Comment: beer (1-2 cans per day), remote heavy ETOH  . Drug Use: No  . Sexually Active: Not on file     Comment: trying to  cut down smoking   Other Topics Concern  . Not on file   Social History Narrative   [place] lives in [place] with spouse and son.  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     Allergies  Allergen Reactions  . Aspirin     Sob, nausea, trouble breathing  . Tylenol (Acetaminophen)     Name brand only (causes sob and nausea)     Outpatient Prescriptions Prior to Visit  Medication Sig Dispense Refill  . albuterol (PROAIR HFA) 108 (90 BASE) MCG/ACT inhaler Inhale 2 puffs into the lungs every 6 (six) hours as needed.      . cetirizine (ZYRTEC) 10 MG tablet Take 10 mg by mouth as needed.      . docusate sodium (COLACE) 100 MG capsule Take 1 capsule (100 mg total) by mouth 2 (two) times daily.  30 capsule  0  . DULoxetine (CYMBALTA) 30 MG capsule Take 30 mg by mouth at bedtime.      . DULoxetine (CYMBALTA) 60 MG capsule Take 60 mg by mouth every morning.       Marland Kitchen  Fluticasone-Salmeterol (ADVAIR) 250-50 MCG/DOSE AEPB Inhale 1 puff into the lungs 2 (two) times daily.  60 each  6  . omeprazole (PRILOSEC) 40 MG capsule Take 40 mg by mouth daily.      Marland Kitchen oxyCODONE-acetaminophen (PERCOCET) 10-325 MG per tablet Take 1 tablet by mouth every 4 (four) hours as needed for pain.  50 tablet  0  . Rivaroxaban (XARELTO) 20 MG TABS Take 1 tablet (20 mg total) by mouth daily.  30 tablet  0  . simvastatin (ZOCOR) 40 MG tablet Take 40 mg by mouth every evening.      Marland Kitchen aspirin 81 MG tablet ON HOLD       No facility-administered medications prior to visit.       Review of Systems  Constitutional: Negative for fatigue.  HENT: Negative for congestion, sneezing, postnasal drip and sinus pressure.   Respiratory: Positive for cough. Negative for choking and chest tightness.   Genitourinary: Negative for difficulty urinating.  Neurological: Negative for syncope.       Objective:   Physical Exam  Filed Vitals:   10/30/12 1010  BP: 110/70  Pulse: 62  Temp: 97.9 F (36.6 C)  TempSrc: Oral  Height: 6' (1.829 m)  Weight:  256 lb (116.121 kg)  SpO2: 98%    Gen: Pleasant, well-nourished, in no distress,  normal affect  ENT: No lesions,  mouth clear,  oropharynx clear, no postnasal drip  Neck: No JVD, no TMG, no carotid bruits  Lungs: No use of accessory muscles, no dullness to percussion, distant BS  Cardiovascular: RRR, heart sounds normal, no murmur or gallops, no peripheral edema  Abdomen: soft and NT, no HSM,  BS normal  Musculoskeletal: No deformities, no cyanosis or clubbing  Neuro: alert, non focal  Skin: Warm, no lesions or rashes      Assessment & Plan:   COPD (chronic obstructive pulmonary disease) Gold C Gold C copd with ongoing tobacco use Probable OSA Plan Get sleep study done Stay on advair Focus on smoking cessation with nicoderm 21mg  patch plus 4mg  nicorette minis to supplement about 3-4 times daily Return 3 months   Tobacco abuse Pt counseled on smoking cessation, spent 3-58min on same    Updated Medication List Outpatient Encounter Prescriptions as of 10/30/2012  Medication Sig Dispense Refill  . albuterol (PROAIR HFA) 108 (90 BASE) MCG/ACT inhaler Inhale 2 puffs into the lungs every 6 (six) hours as needed.      . cetirizine (ZYRTEC) 10 MG tablet Take 10 mg by mouth as needed.      . docusate sodium (COLACE) 100 MG capsule Take 1 capsule (100 mg total) by mouth 2 (two) times daily.  30 capsule  0  . DULoxetine (CYMBALTA) 30 MG capsule Take 30 mg by mouth at bedtime.      . DULoxetine (CYMBALTA) 60 MG capsule Take 60 mg by mouth every morning.       . Fluticasone-Salmeterol (ADVAIR) 250-50 MCG/DOSE AEPB Inhale 1 puff into the lungs 2 (two) times daily.  60 each  6  . omeprazole (PRILOSEC) 40 MG capsule Take 40 mg by mouth daily.      Marland Kitchen oxyCODONE-acetaminophen (PERCOCET) 10-325 MG per tablet Take 1 tablet by mouth every 4 (four) hours as needed for pain.  50 tablet  0  . Rivaroxaban (XARELTO) 20 MG TABS Take 1 tablet (20 mg total) by mouth daily.  30 tablet  0  .  simvastatin (ZOCOR) 40 MG tablet Take 40 mg by mouth every  evening.      Marland Kitchen aspirin 81 MG tablet ON HOLD      . [DISCONTINUED] HYDROcodone-acetaminophen (NORCO) 10-325 MG per tablet Take 1 tablet by mouth every 4 (four) hours as needed.       No facility-administered encounter medications on file as of 10/30/2012.

## 2012-11-02 ENCOUNTER — Encounter (HOSPITAL_COMMUNITY): Payer: Self-pay | Admitting: Pharmacy Technician

## 2012-11-07 ENCOUNTER — Other Ambulatory Visit (INDEPENDENT_AMBULATORY_CARE_PROVIDER_SITE_OTHER): Payer: Medicare Other

## 2012-11-07 DIAGNOSIS — I4892 Unspecified atrial flutter: Secondary | ICD-10-CM

## 2012-11-07 LAB — CBC WITH DIFFERENTIAL/PLATELET
Basophils Absolute: 0.1 10*3/uL (ref 0.0–0.1)
Basophils Relative: 0.6 % (ref 0.0–3.0)
Eosinophils Relative: 6.2 % — ABNORMAL HIGH (ref 0.0–5.0)
Lymphocytes Relative: 30.3 % (ref 12.0–46.0)
Lymphs Abs: 2.6 10*3/uL (ref 0.7–4.0)
MCV: 91 fl (ref 78.0–100.0)
Neutrophils Relative %: 55.4 % (ref 43.0–77.0)
Platelets: 195 10*3/uL (ref 150.0–400.0)
RBC: 4.98 Mil/uL (ref 4.22–5.81)
WBC: 8.5 10*3/uL (ref 4.5–10.5)

## 2012-11-07 LAB — BASIC METABOLIC PANEL
BUN: 8 mg/dL (ref 6–23)
Calcium: 9 mg/dL (ref 8.4–10.5)
Chloride: 102 mEq/L (ref 96–112)
Creatinine, Ser: 1 mg/dL (ref 0.4–1.5)
GFR: 85.55 mL/min (ref >60.00)

## 2012-11-14 ENCOUNTER — Ambulatory Visit (HOSPITAL_COMMUNITY)
Admission: RE | Admit: 2012-11-14 | Discharge: 2012-11-14 | Disposition: A | Discharge status: Home / Self Care | Payer: Medicare Other | Source: Ambulatory Visit | Attending: Internal Medicine | Admitting: Internal Medicine

## 2012-11-14 ENCOUNTER — Encounter (HOSPITAL_COMMUNITY)
Admission: RE | Disposition: A | Discharge status: Home / Self Care | Payer: Self-pay | Source: Ambulatory Visit | Attending: Internal Medicine

## 2012-11-14 ENCOUNTER — Encounter (HOSPITAL_COMMUNITY): Payer: Self-pay | Admitting: Anesthesiology

## 2012-11-14 ENCOUNTER — Ambulatory Visit (HOSPITAL_COMMUNITY): Payer: Medicare Other | Admitting: Anesthesiology

## 2012-11-14 DIAGNOSIS — I4892 Unspecified atrial flutter: Secondary | ICD-10-CM

## 2012-11-14 DIAGNOSIS — E7800 Pure hypercholesterolemia, unspecified: Secondary | ICD-10-CM

## 2012-11-14 DIAGNOSIS — J449 Chronic obstructive pulmonary disease, unspecified: Secondary | ICD-10-CM

## 2012-11-14 DIAGNOSIS — R0683 Snoring: Secondary | ICD-10-CM

## 2012-11-14 DIAGNOSIS — Z720 Tobacco use: Secondary | ICD-10-CM

## 2012-11-14 DIAGNOSIS — J4489 Other specified chronic obstructive pulmonary disease: Secondary | ICD-10-CM | POA: Insufficient documentation

## 2012-11-14 HISTORY — PX: ATRIAL FLUTTER ABLATION: SHX5733

## 2012-11-14 SURGERY — ATRIAL FLUTTER ABLATION
Anesthesia: Monitor Anesthesia Care

## 2012-11-14 MED ORDER — METOPROLOL SUCCINATE ER 25 MG PO TB24: 25.0000 mg | ORAL_TABLET | Freq: Every day | ORAL | Status: DC

## 2012-11-14 MED ORDER — PROPOFOL INFUSION 10 MG/ML OPTIME
INTRAVENOUS | Status: DC | PRN
  Administered 2012-11-14: 100 ug/kg/min via INTRAVENOUS

## 2012-11-14 MED ORDER — SODIUM CHLORIDE 0.9 % IJ SOLN: 3.0000 mL | Freq: Two times a day (BID) | INTRAMUSCULAR | Status: DC

## 2012-11-14 MED ORDER — SODIUM CHLORIDE 0.9 % IV SOLN
INTRAVENOUS | Status: DC | PRN
  Administered 2012-11-14: 08:00:00 via INTRAVENOUS

## 2012-11-14 MED ORDER — MIDAZOLAM HCL 5 MG/5ML IJ SOLN
INTRAMUSCULAR | Status: DC | PRN
  Administered 2012-11-14: 2 mg via INTRAVENOUS

## 2012-11-14 MED ORDER — SODIUM CHLORIDE 0.9 % IJ SOLN: 3.0000 mL | INTRAMUSCULAR | Status: DC | PRN

## 2012-11-14 MED ORDER — FENTANYL CITRATE 0.05 MG/ML IJ SOLN
INTRAMUSCULAR | Status: DC | PRN
  Administered 2012-11-14: 50 ug via INTRAVENOUS

## 2012-11-14 MED ORDER — BUPIVACAINE HCL (PF) 0.25 % IJ SOLN
INTRAMUSCULAR | Status: AC
  Filled 2012-11-14: qty 30

## 2012-11-14 MED ORDER — SODIUM CHLORIDE 0.9 % IV SOLN: 250.0000 mL | INTRAVENOUS | Status: DC | PRN

## 2012-11-14 MED ORDER — ONDANSETRON HCL 4 MG/2ML IJ SOLN: 4.0000 mg | Freq: Four times a day (QID) | INTRAMUSCULAR | Status: DC | PRN

## 2012-11-14 NOTE — Transfer of Care (Signed)
Immediate Anesthesia Transfer of Care Note  Patient: Miguel [name]  Procedure(s) Performed: Procedure(s): ATRIAL FLUTTER ABLATION (N/A)  Patient Location: PACU and Cath Lab  Anesthesia Type:MAC  Level of Consciousness: awake, alert  and oriented  Airway & Oxygen Therapy: Patient Spontanous Breathing and Patient connected to face mask oxygen  Post-op Assessment: Report given to PACU RN, Post -op Vital signs reviewed and stable and Patient moving all extremities  Post vital signs: Reviewed and stable  Complications: No apparent anesthesia complications

## 2012-11-14 NOTE — Progress Notes (Signed)
UP AND WALKED AND TOL WELL; RIGHT GROIN STABLE; NO BLEEDING OR HEMATOMA; DR Johney Frame IN TO SEE CLIENT

## 2012-11-14 NOTE — Anesthesia Preprocedure Evaluation (Addendum)
Anesthesia Evaluation  Patient identified by MRN, date of birth, ID band Patient awake    Reviewed: Allergy & Precautions, H&P , NPO status , Patient's Chart, lab work & pertinent test results  Airway Mallampati: III TM Distance: >3 FB Neck ROM: Full    Dental  (+) Edentulous Upper and Edentulous Lower   Pulmonary sleep apnea , COPDCurrent Smoker,  + rhonchi         Cardiovascular + dysrhythmias Atrial Fibrillation Rhythm:Irregular Rate:Normal     Neuro/Psych Depression    GI/Hepatic GERD-  Medicated,  Endo/Other    Renal/GU      Musculoskeletal   Abdominal (+) + obese,   Peds  Hematology   Anesthesia Other Findings   Reproductive/Obstetrics                         Anesthesia Physical Anesthesia Plan  ASA: III  Anesthesia Plan: MAC   Post-op Pain Management:    Induction: Intravenous  Airway Management Planned: Simple Face Mask  Additional Equipment:   Intra-op Plan:   Post-operative Plan:   Informed Consent: I have reviewed the patients History and Physical, chart, labs and discussed the procedure including the risks, benefits and alternatives for the proposed anesthesia with the patient or authorized representative who has indicated his/her understanding and acceptance.   Dental advisory given  Plan Discussed with: CRNA and Surgeon  Anesthesia Plan Comments:        Anesthesia Quick Evaluation

## 2012-11-14 NOTE — Op Note (Addendum)
PREPROCEDURE DIAGNOSIS: Typical Atrial flutter.   POSTPROCEDURE DIAGNOSIS: Typical Atrial flutter.   PROCEDURES:  1. Comprehensive electrophysiology study.  2. Coronary sinus pacing and recording.  3. Mapping of atrial flutter.  5. Ablation of atrial flutter.  6. Arrhythmia induction with pacing.   INTRODUCTION: Miguel [name] is a 62 y.o. male with symptomatic typical atrial flutter. He presents today for EP study and radiofrequency ablation.   DESCRIPTION OF PROCEDURE: Informed written consent was obtained, and the patient was brought to the Electrophysiology Lab in the fasting state. He was adequately sedated as outlined in  the anesthesia report. The patient's right groin was prepped and draped in the usual sterile fashion by the EP lab staff. Using a percutaneous Seldinger technique, one 6, one 7, and one 8-French hemostasis sheaths were placed in the right common femoral vein. A 7-French Biosence Webster decapolar catheter was introduced through the right common femoral vein and advanced into the coronary sinus for recording and pacing from this location. A 6-French quadripolar Josephson catheter was introduced through the right common femoral vein and advanced into the right ventricle for recording and pacing, but this catheter was then pulled back to the His bundle location.   Initial Measurements: The patient presented to the electrophysiology lab in sinus rhythm. His PR interval measured 215 msec with a QRS duration of 92 msec and a QT interval 398 msec. His RR interval measured 787 msec. His AH interval measured 93 msec with an HV interval of 39 msec.   Pacing Maneuvers: Ventricular pacing was performed which revealed midline/ concentric VA conduction with a VA wenckebach cycle length of 400 msec. Rapid atrial pacing was performed which revealed an AV Wenckebach cycle length of 360 msec. There was no evidence of PR greater than RR. Rapid atrial pacing was continued down  to a cycle  length of 220 msec with no arrhythmias observed. Multiple attempts to induce atrial flutter were made, however, this was unsuccessful.  Ablation:  As the patient has typical atrial flutter documented, I felt that the most prudent approach at this point was to proceed with cavotricuspid isthmus ablation. A 7-French [employer] II XP 10-mm ablation catheter was introduced through the right common femoral vein and advanced into the right atrium. Atrial mapping along  the cavotricuspid isthmus was performed. This demonstrated a standard  CavoTricuspid isthmus. A series of 6 radiofrequency applications were delivered  along the cavotricuspid isthmus with a target temperature of 60 degrees at 70-80 watts for 120 seconds each. Complete bidirectional isthmus block was achieved with a stimulus to earliest atrial activation recorded bidirectional across the isthmus measuring 170 msec. A dual decapolar circular mapping catheter was introduced through the right common femoral vein and advanced into the right atrium. This Halo catheter was then positioned around the cavotricuspid isthmus. Differential atrial pacing was again performed which revealed complete bidirectional isthmus block. The patient was  observed for 20 minutes without return of conduction through the isthmus.   Measurements Post ablation: Following ablation, the AH interval measured 99 msec with an HV interval of 51 msec. The AV Wenckebach cycle length was 350 msec. Rapid atrial pacing was again performed down to a cycle length of 200 msec without any arrhythmias observed today. The procedure was therefore considered completed. All catheters were removed and the sheaths were aspirated and flushed. The sheaths were removed and hemostasis was assured. There were no early apparent complications.    CONCLUSIONS:  1. Sinus rhythm upon presentation.  2. Atrial  flutter, not induced today  3. Empiric cavotricuspid isthmus ablation was  performed with complete bidirectional isthmus block achieved.  4. No inducible arrhythmias following ablation.  5. No early apparent complications.

## 2012-11-14 NOTE — Preoperative (Signed)
Beta Blockers   Reason not to administer Beta Blockers:Not Applicable 

## 2012-11-14 NOTE — H&P (Signed)
Primary Care Physician: Lerry Liner, MD Referring Physician:  Dr Junie Panning Miguel [name] is a 62 y.o. male with a h/o recently discovered atrial flutter who presents for Ep consultation.  He reports that he presented for routine surgery on his hand when preoperative EKG 08/14/12 revealed rate controlled atrial flutter.  His surgery was postponed and he was referred to Dr Royann Shivers.  He was found to have relatively asymptomatic atrial flutter.  He reports having "an extra heart beat" for years but does not recall having been diagnosed with atrial fibrillation or atrial flutter.  He feels that his exercise tolerance is stable.  He has chronic bilateral hip issues and is s/p bilateral hip replacement for avascular necrosis.  He snores and his wife notices that he stops breathing at times. Today, he denies symptoms of palpitations, chest pain, shortness of breath, orthopnea, PND, lower extremity edema, dizziness, presyncope, syncope, or neurologic sequela. The patient is tolerating medications without difficulties and is otherwise without complaint today.     Past Medical History   Diagnosis  Date   .  Seasonal allergies     .  Cat allergies         and dogs   .  COPD (chronic obstructive pulmonary disease)     .  Hyperlipidemia     .  Depression     .  GERD (gastroesophageal reflux disease)     .  Avascular necrosis of bones of both hips         s/p bilateral hip replacement       Past Surgical History   Procedure  Date   .  Total hip arthroplasty         right and left   .  Appendectomy     .  Sinus surgery with instatrak         x3   .  Hernia repair         x3   .  Right hand surgery           Current Outpatient Prescriptions   Medication  Sig  Dispense  Refill   .  albuterol (PROAIR HFA) 108 (90 BASE) MCG/ACT inhaler  Inhale 2 puffs into the lungs every 6 (six) hours as needed.         Marland Kitchen  aspirin 81 MG tablet  Take 81 mg by mouth 2 (two) times daily.         .   DULoxetine (CYMBALTA) 30 MG capsule  Take 30 mg by mouth at bedtime.         .  DULoxetine (CYMBALTA) 60 MG capsule  Take 60 mg by mouth every morning.          .  Fluticasone-Salmeterol (ADVAIR) 250-50 MCG/DOSE AEPB  Inhale 1 puff into the lungs 2 (two) times daily.   60 each   6   .  omeprazole (PRILOSEC) 40 MG capsule  Take 40 mg by mouth daily.         .  simvastatin (ZOCOR) 40 MG tablet  Take 40 mg by mouth every evening.               Allergies   Allergen  Reactions   .  Aspirin         Sob, nausea, trouble breathing   .  Tylenol (Acetaminophen)         Name brand only (causes sob and nausea)  History       Social History   .  Marital Status:  Married       Spouse Name:  N/A       Number of Children:  2   .  Years of Education:  N/A       Occupational History   .  disability         painting       Social History Main Topics   .  Smoking status:  Current Some Day Smoker -- 1.0 packs/day for 45 years       Types:  Cigarettes       Last Attempt to Quit:  11/11/2011   .  Smokeless tobacco:  Never Used         Comment: < 1PPD, trying to quit   .  Alcohol Use:  Yes         Comment: beer (1-2 cans per day), remote heavy ETOH   .  Drug Use:  No   .  Sexually Active:  Not on file       Other Topics  Concern   .  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         Family History   Problem  Relation  Age of Onset   .  Breast cancer  Sister     .  Emphysema  Mother     .  Arthritis  Mother     .  Diabetes  Sister          ROS- All systems are reviewed and negative except as per the HPI above   Physical Exam: Filed Vitals:     08/25/12 1542   BP:  128/68   Pulse:  95   Height:  6' (1.829 m)   Weight:  256 lb (116.121 kg)   SpO2:  98%        GEN- The patient is well appearing, alert and oriented x 3 today.    Head- normocephalic, atraumatic Eyes-  Sclera clear,  conjunctiva pink Ears- hearing intact Oropharynx- clear Neck- supple, no JVP Lymph- no cervical lymphadenopathy Lungs- Clear to ausculation bilaterally, normal work of breathing Heart- irregular rate and rhythm, no murmurs, rubs or gallops, PMI not laterally displaced GI- soft, NT, ND, + BS Extremities- no clubbing, cyanosis, or edema MS- no significant deformity or atrophy Skin- no rash or lesion Psych- euthymic mood, full affect Neuro- strength and sensation are intact   EKG from the office revealed typical appearing atria lflutter, V rate 95 bpm, nonspecific ST/T changes Echo 08/22/12- mild LVH with normal LV function, no significant valvular disease, LA size 39mm   Assessment and Plan:            Atrial flutter       The patient has minimally symptomatic typical appearing atrial flutter.  I have reviewed his record in echart as well as records from Dr [employer] office and do not see any evidence of afib.  Though he has lung disease, his LA size is preserved.  He is minimally symptomatic. I agree with Dr Royann Shivers that atrial flutter ablation is a very good option with reduction in long term stroke risks.  Therapeutic strategies for atrial flutter including medicine and ablation were discussed in detail with the patient today. Risk, benefits, and alternatives to EP study and radiofrequency  ablation were also discussed in detail today. These risks include but are not limited to stroke, bleeding, vascular damage, tamponade, perforation, damage to the heart and other structures, AV block requiring pacemaker, worsening renal function, and death. The patient understands these risk and wishes to proceed.  He has been adequately anticoagulated with xarelto for at least 4 weeks without interuption

## 2012-11-14 NOTE — Anesthesia Procedure Notes (Addendum)
Procedure Name: MAC Date/Time: 11/14/2012 8:03 AM Performed by: Quentin Ore Pre-anesthesia Checklist: Patient identified, Emergency Drugs available, Suction available, Patient being monitored and Timeout performed Patient Re-evaluated:Patient Re-evaluated prior to inductionOxygen Delivery Method: Simple face mask Placement Confirmation: positive ETCO2

## 2012-11-14 NOTE — Anesthesia Postprocedure Evaluation (Signed)
  Anesthesia Post-op Note  Patient: Miguel [name]  Procedure(s) Performed: Procedure(s): ATRIAL FLUTTER ABLATION (N/A)  Patient Location: Cath Lab  Anesthesia Type:MAC  Level of Consciousness: awake and alert   Airway and Oxygen Therapy: Patient Spontanous Breathing  Post-op Pain: none  Post-op Assessment: Post-op Vital signs reviewed, Patient's Cardiovascular Status Stable, Respiratory Function Stable, Patent Airway, No signs of Nausea or vomiting and Pain level controlled  Post-op Vital Signs: stable  Complications: No apparent anesthesia complications

## 2012-12-13 ENCOUNTER — Ambulatory Visit (INDEPENDENT_AMBULATORY_CARE_PROVIDER_SITE_OTHER): Payer: Medicare Other | Admitting: Internal Medicine

## 2012-12-13 ENCOUNTER — Encounter: Payer: Self-pay | Admitting: Internal Medicine

## 2012-12-13 VITALS — BP 126/77 | HR 55 | Ht 72.0 in | Wt 255.0 lb

## 2012-12-13 DIAGNOSIS — I4892 Unspecified atrial flutter: Secondary | ICD-10-CM

## 2012-12-13 NOTE — Patient Instructions (Addendum)
Your physician recommends that you schedule a follow-up appointment : as needed.  Your physician has recommended you make the following change in your medication:  1.  STOP TOPROL.  2.  On Friday 12-15-12, STOP XARELTO.

## 2012-12-13 NOTE — Progress Notes (Signed)
PCP: Michiel Sites, MD Primary Cardiologist:  Miguel [name] is a 62 y.o. male who presents today for routine electrophysiology followup.  Since last being seen in our clinic, the patient reports doing very well.  Today, he denies symptoms of palpitations, chest pain, shortness of breath,  lower extremity edema, dizziness, presyncope, or syncope.  The patient is otherwise without complaint today.   Past Medical History  Diagnosis Date  . Seasonal allergies   . Cat allergies     and dogs  . COPD (chronic obstructive pulmonary disease)   . Hyperlipidemia   . Depression   . GERD (gastroesophageal reflux disease)   . Avascular necrosis of bones of both hips     s/p bilateral hip replacement  . Sleep apnea     sch for sleep spudy after hand surg  . Arthritis   . Dysrhythmia     atrial flutter   Past Surgical History  Procedure Laterality Date  . Total hip arthroplasty      right and left  . Appendectomy    . Sinus surgery with instatrak      x3  . Hernia repair      x3  . Right hand surgery    . Tonsillectomy    . Wrist arthroscopy with debridement  09/27/2012    Procedure: WRIST ARTHROSCOPY WITH DEBRIDEMENT;  Surgeon: Sharma Covert, MD;  Location: [facility];  Service: Orthopedics;  Laterality: Left;  Left Wrist Arthroscopy and Debridement of Triangular Cartilege Complex Tear and Lunotriquetral ligament,  Exploration of Sixth Dorsal Compartment and Repair  . Atrial flutter ablation  11/14/12    CTI ablation by Dr Johney Frame    Current Outpatient Prescriptions  Medication Sig Dispense Refill  . albuterol (PROAIR HFA) 108 (90 BASE) MCG/ACT inhaler Inhale 2 puffs into the lungs every 6 (six) hours as needed. For wheezing      . cetirizine (ZYRTEC) 10 MG tablet Take 10 mg by mouth daily as needed. For allergy symptoms      . DULoxetine (CYMBALTA) 30 MG capsule Take 30 mg by mouth at bedtime.      . DULoxetine (CYMBALTA) 60 MG capsule Take 60 mg by mouth every  morning.       . Fluticasone-Salmeterol (ADVAIR) 250-50 MCG/DOSE AEPB Inhale 1 puff into the lungs 2 (two) times daily.      . metoprolol succinate (TOPROL-XL) 25 MG 24 hr tablet Take 1 tablet (25 mg total) by mouth daily. Take with or immediately following a meal.  30 tablet  6  . omeprazole (PRILOSEC) 40 MG capsule Take 40 mg by mouth daily.      Marland Kitchen oxyCODONE-acetaminophen (PERCOCET) 10-325 MG per tablet Take 1 tablet by mouth every 4 (four) hours as needed for pain.  50 tablet  0  . Rivaroxaban (XARELTO) 20 MG TABS Take 1 tablet (20 mg total) by mouth daily.  30 tablet  0   No current facility-administered medications for this visit.    Physical Exam: Filed Vitals:   12/13/12 1111  BP: 126/77  Pulse: 55  Height: 6' (1.829 m)  Weight: 255 lb (115.667 kg)    GEN- The patient is well appearing, alert and oriented x 3 today.   Head- normocephalic, atraumatic Eyes-  Sclera clear, conjunctiva pink Ears- hearing intact Oropharynx- clear Lungs- Clear to ausculation bilaterally, normal work of breathing Heart- Regular rate and rhythm, no murmurs, rubs or gallops, PMI not laterally displaced GI- soft, NT, ND, + BS Extremities-  no clubbing, cyanosis, or edema  ekg today reveals sinus rhythm with PVCs, otherwise normal ekg  Assessment and Plan:  1. Atrial flutter Doing well s/p CTI Stop toprol Stop xarelto  Follow-up with Dr C I will see as needed going forward.

## 2013-01-10 ENCOUNTER — Telehealth: Payer: Self-pay | Admitting: Critical Care Medicine

## 2013-01-10 NOTE — Telephone Encounter (Signed)
Called pt to schd follow up apt. Left message 3x. Not return call back. Sent letter 01/10/13.  °

## 2013-02-01 ENCOUNTER — Ambulatory Visit (INDEPENDENT_AMBULATORY_CARE_PROVIDER_SITE_OTHER): Payer: Medicare Other | Admitting: Critical Care Medicine

## 2013-02-01 ENCOUNTER — Encounter: Payer: Self-pay | Admitting: Critical Care Medicine

## 2013-02-01 VITALS — BP 140/86 | HR 84 | Temp 98.2°F | Ht 72.0 in | Wt 254.5 lb

## 2013-02-01 DIAGNOSIS — J449 Chronic obstructive pulmonary disease, unspecified: Secondary | ICD-10-CM

## 2013-02-01 DIAGNOSIS — F17200 Nicotine dependence, unspecified, uncomplicated: Secondary | ICD-10-CM

## 2013-02-01 DIAGNOSIS — Z720 Tobacco use: Secondary | ICD-10-CM

## 2013-02-01 NOTE — Progress Notes (Signed)
Subjective:    Patient ID: Miguel [name], male    DOB: October 08, 1950, 62 y.o.   MRN: [id number]  HPI Comments:     02/01/2013 Still smoking 12cig per day.  Pt with many issues and stress. Dyspnea is still present. Still coughing. No arrhythmias, off all cardiac meds.    Past Medical History  Diagnosis Date  . Seasonal allergies   . Cat allergies     and dogs  . COPD (chronic obstructive pulmonary disease)   . Hyperlipidemia   . Depression   . GERD (gastroesophageal reflux disease)   . Avascular necrosis of bones of both hips     s/p bilateral hip replacement  . Sleep apnea     sch for sleep spudy after hand surg  . Arthritis   . Dysrhythmia     atrial flutter     Family History  Problem Relation Age of Onset  . Breast cancer Sister   . Emphysema Mother   . Arthritis Mother   . Diabetes Sister      History   Social History  . Marital Status: Married    Spouse Name: N/A    Number of Children: 2  . 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 Smoking status: Current Every Day Smoker -- 0.50 packs/day    Types: Cigarettes  . Smokeless tobacco: Never Used     Comment: Started smoking at age 22.  Currently smoking 1/2 ppd.  . Alcohol Use: Yes     Comment: beer (1-2 cans per day), remote heavy ETOH  . Drug Use: No  . Sexually Active: Not on file     Comment: trying to cut down smoking   Other Topics Concern  . 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     Allergies  Allergen Reactions  . Aspirin Shortness Of Breath and Nausea Only  . Tylenol (Acetaminophen) Shortness Of Breath and Nausea Only    Name brand only (causes sob and nausea)     Outpatient Prescriptions Prior to Visit  Medication Sig Dispense Refill  . albuterol (PROAIR HFA) 108 (90 BASE) MCG/ACT inhaler Inhale 2 puffs into the lungs every 6 (six) hours as  needed. For wheezing      . cetirizine (ZYRTEC) 10 MG tablet Take 10 mg by mouth daily as needed. For allergy symptoms      . DULoxetine (CYMBALTA) 30 MG capsule Take 30 mg by mouth at bedtime.      . DULoxetine (CYMBALTA) 60 MG capsule Take 60 mg by mouth every morning.       . Fluticasone-Salmeterol (ADVAIR) 250-50 MCG/DOSE AEPB Inhale 1 puff into the lungs 2 (two) times daily.      Marland Kitchen omeprazole (PRILOSEC) 40 MG capsule Take 40 mg by mouth daily.      Marland Kitchen oxyCODONE-acetaminophen (PERCOCET) 10-325 MG per tablet Take 1 tablet by mouth every 4 (four) hours as needed for pain.  50 tablet  0   No facility-administered medications prior to visit.       Review of Systems  Constitutional: Negative for fatigue.  HENT: Negative for congestion, sneezing, postnasal drip and sinus pressure.   Respiratory: Positive for cough. Negative for choking and chest tightness.   Genitourinary: Negative for difficulty urinating.  Neurological: Negative for syncope.  Objective:   Physical Exam  Filed Vitals:   02/01/13 1050  BP: 140/86  Pulse: 84  Temp: 98.2 F (36.8 C)  TempSrc: Oral  Height: 6' (1.829 m)  Weight: 254 lb 8 oz (115.44 kg)  SpO2: 96%    Gen: Pleasant, well-nourished, in no distress,  normal affect  ENT: No lesions,  mouth clear,  oropharynx clear, no postnasal drip  Neck: No JVD, no TMG, no carotid bruits  Lungs: No use of accessory muscles, no dullness to percussion, distant BS  Cardiovascular: RRR, heart sounds normal, no murmur or gallops, no peripheral edema  Abdomen: soft and NT, no HSM,  BS normal  Musculoskeletal: No deformities, no cyanosis or clubbing  Neuro: alert, non focal  Skin: Warm, no lesions or rashes      Assessment & Plan:   COPD (chronic obstructive pulmonary disease) Gold C Gold stage C. COPD stable at this Ongoing smoking cessation is a party Plan Smoking cessation counseling given to the patient Maintain Advair  Tobacco  abuse Greater than 10 minutes smoking cessation counseling given to the patient    Updated Medication List Outpatient Encounter Prescriptions as of 02/01/2013  Medication Sig Dispense Refill  . albuterol (PROAIR HFA) 108 (90 BASE) MCG/ACT inhaler Inhale 2 puffs into the lungs every 6 (six) hours as needed. For wheezing      . cetirizine (ZYRTEC) 10 MG tablet Take 10 mg by mouth daily as needed. For allergy symptoms      . DULoxetine (CYMBALTA) 30 MG capsule Take 30 mg by mouth at bedtime.      . DULoxetine (CYMBALTA) 60 MG capsule Take 60 mg by mouth every morning.       . Fluticasone-Salmeterol (ADVAIR) 250-50 MCG/DOSE AEPB Inhale 1 puff into the lungs 2 (two) times daily.      Marland Kitchen omeprazole (PRILOSEC) 40 MG capsule Take 40 mg by mouth daily.      . [DISCONTINUED] oxyCODONE-acetaminophen (PERCOCET) 10-325 MG per tablet Take 1 tablet by mouth every 4 (four) hours as needed for pain.  50 tablet  0   No facility-administered encounter medications on file as of 02/01/2013.

## 2013-02-01 NOTE — Patient Instructions (Addendum)
Focus on smoking cessation No change in advair REturn 6  months

## 2013-02-02 NOTE — Assessment & Plan Note (Signed)
Gold stage C. COPD stable at this Ongoing smoking cessation is a party Plan Smoking cessation counseling given to the patient Maintain Advair

## 2013-02-02 NOTE — Assessment & Plan Note (Signed)
Greater than 10 minutes smoking cessation counseling given to the patient 

## 2013-02-17 ENCOUNTER — Encounter: Payer: Self-pay | Admitting: *Deleted

## 2013-02-19 ENCOUNTER — Encounter: Payer: Self-pay | Admitting: Cardiovascular Disease

## 2013-02-22 ENCOUNTER — Ambulatory Visit (INDEPENDENT_AMBULATORY_CARE_PROVIDER_SITE_OTHER): Payer: Medicare Other | Admitting: Cardiovascular Disease

## 2013-02-22 ENCOUNTER — Encounter: Payer: Self-pay | Admitting: Cardiovascular Disease

## 2013-02-22 VITALS — BP 138/80 | HR 70 | Resp 18 | Ht 72.0 in | Wt 253.3 lb

## 2013-02-22 DIAGNOSIS — E7800 Pure hypercholesterolemia, unspecified: Secondary | ICD-10-CM

## 2013-02-22 DIAGNOSIS — G4733 Obstructive sleep apnea (adult) (pediatric): Secondary | ICD-10-CM

## 2013-02-22 DIAGNOSIS — J449 Chronic obstructive pulmonary disease, unspecified: Secondary | ICD-10-CM

## 2013-02-22 DIAGNOSIS — I4892 Unspecified atrial flutter: Secondary | ICD-10-CM

## 2013-02-22 DIAGNOSIS — Z720 Tobacco use: Secondary | ICD-10-CM

## 2013-02-22 DIAGNOSIS — F17200 Nicotine dependence, unspecified, uncomplicated: Secondary | ICD-10-CM

## 2013-02-22 DIAGNOSIS — E669 Obesity, unspecified: Secondary | ICD-10-CM

## 2013-02-22 NOTE — Patient Instructions (Signed)
It is important that you schedule your sleep study/CPAP titration as soon as possible Your physician encouraged you to lose weight for better health. Your physician discussed the hazards of tobacco use. Tobacco use cessation is recommended and techniques and options to help you quit were discussed. Your physician recommends that you schedule a follow-up appointment in: 6 months

## 2013-02-24 ENCOUNTER — Encounter: Payer: Self-pay | Admitting: Cardiovascular Disease

## 2013-02-24 DIAGNOSIS — E669 Obesity, unspecified: Secondary | ICD-10-CM | POA: Insufficient documentation

## 2013-02-24 DIAGNOSIS — G4733 Obstructive sleep apnea (adult) (pediatric): Secondary | ICD-10-CM | POA: Insufficient documentation

## 2013-02-24 NOTE — Assessment & Plan Note (Signed)
10 minutes were spent discussing the importance of smoking cessation. Also discussed different methods to assist with smoking cessation.

## 2013-02-24 NOTE — Assessment & Plan Note (Signed)
I went into great detail about the importance of treating obstructive sleep apnea. We discussed the pathophysiological mechanisms that may lead to injury to his heart and lungs as well as increased risk of accidents, stroke and cardiac arrhythmia. I strongly suspect is atrial flutter is related to untreated obstructive sleep apnea and told him that he may come back with atrial fibrillation in the future. He promises to make an appointment for the CPAP titration portion of his sleep study

## 2013-02-24 NOTE — Progress Notes (Signed)
Patient ID: Miguel [name], male   DOB: February 15, 1951, 62 y.o.   MRN: [id number]      Reason for office visit Obstructive sleep apnea, status post atrial flutter ablation   Miguel [name] had a successful caval tricuspid isthmus ablation roughly 3 months ago and has not had any recurrence of atrial flutter. He feels well. He continues to smoke roughly half a pack of cigarettes a day and states that it is very hard to quit. He had a sleep study that confirmed the suspicion of obstructive sleep apnea. He says that this study supple was a very unpleasant experience. Because of this he has not scheduled a followup CPAP titration study. His apnea hypopnea index was 14.8 per hour the respiratory disturbance index was 45 per hour. He is seeing Dr.Olin degrees per orthopedics for severe right hip pain and is hoping he'll be able to avoid hip surgery.    Allergies  Allergen Reactions  . Aspirin Shortness Of Breath and Nausea Only  . Tylenol (Acetaminophen) Shortness Of Breath and Nausea Only    Name brand only (causes sob and nausea)    Current Outpatient Prescriptions  Medication Sig Dispense Refill  . albuterol (PROAIR HFA) 108 (90 BASE) MCG/ACT inhaler Inhale 2 puffs into the lungs every 6 (six) hours as needed. For wheezing      . cetirizine (ZYRTEC) 10 MG tablet Take 10 mg by mouth daily as needed. For allergy symptoms      . DULoxetine (CYMBALTA) 60 MG capsule Take 60 mg by mouth every morning.       . Fluticasone-Salmeterol (ADVAIR) 250-50 MCG/DOSE AEPB Inhale 1 puff into the lungs 2 (two) times daily.      . metoprolol succinate (TOPROL-XL) 50 MG 24 hr tablet Take 50 mg by mouth daily. Take with or immediately following a meal.      . omeprazole (PRILOSEC) 40 MG capsule Take 40 mg by mouth daily.      . Rivaroxaban (XARELTO) 20 MG TABS Take 20 mg by mouth daily.       No current facility-administered medications for this visit.    Past Medical History  Diagnosis Date  . Seasonal allergies     . Cat allergies     and dogs  . COPD (chronic obstructive pulmonary disease)   . Hyperlipidemia   . Depression   . GERD (gastroesophageal reflux disease)   . Avascular necrosis of bones of both hips     s/p bilateral hip replacement  . Arthritis   . Paroxysmal atrial flutter   . Hypertension   . OSA (obstructive sleep apnea)     Past Surgical History  Procedure Laterality Date  . Total hip arthroplasty      right and left  . Appendectomy    . Sinus surgery with instatrak      x3  . Hernia repair      x3  . Right hand surgery    . Tonsillectomy    . Wrist arthroscopy with debridement  09/27/2012    Procedure: WRIST ARTHROSCOPY WITH DEBRIDEMENT;  Surgeon: Sharma Covert, MD;  Location: [facility];  Service: Orthopedics;  Laterality: Left;  Left Wrist Arthroscopy and Debridement of Triangular Cartilege Complex Tear and Lunotriquetral ligament,  Exploration of Sixth Dorsal Compartment and Repair  . Atrial flutter ablation  11/14/12    CTI ablation by Dr Johney Frame    Family History  Problem Relation Age of Onset  . Breast cancer Sister   .  Emphysema Mother   . Arthritis Mother   . Diabetes Sister   . Cancer Sister   . Diabetes Father   . Asthma Mother     History   Social History  . Marital Status: Married    Spouse Name: N/A    Number of Children: 2  . 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 Smoking status: Current Every Day Smoker -- 0.50 packs/day    Types: Cigarettes  . Smokeless tobacco: Never Used     Comment: Started smoking at age 23.  Currently smoking 1/2 ppd.  . Alcohol Use: Yes     Comment: beer (1-2 cans per day), remote heavy ETOH  . Drug Use: No  . Sexually Active: Not on file     Comment: trying to cut down smoking   Other Topics Concern  . Not on file   Social History Narrative   [place] in [place] with spouse and son.  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    Review  of systems: Continues have problems with daytime somnolence, fatigue and loud snoring The patient specifically denies any chest pain at rest or with exertion, dyspnea at rest or with exertion, orthopnea, paroxysmal nocturnal dyspnea, syncope, palpitations, focal neurological deficits, intermittent claudication, lower extremity edema, unexplained weight gain, cough, hemoptysis or wheezing.  The patient also denies abdominal pain, nausea, vomiting, dysphagia, diarrhea, constipation, polyuria, polydipsia, dysuria, hematuria, frequency, urgency, abnormal bleeding or bruising, fever, chills, unexpected weight changes, mood swings, change in skin or hair texture, change in voice quality, auditory or visual problems, allergic reactions or rashes, new musculoskeletal complaints other than the described right hip pain  PHYSICAL EXAM BP 138/80  Pulse 70  Resp 18  Ht 6' (1.829 m)  Wt 253 lb 4.8 oz (114.896 kg)  BMI 34.35 kg/m2  General: Alert, oriented x3, no distress, moderately obese Head: no evidence of trauma, PERRL, EOMI, no exophtalmos or lid lag, no myxedema, no xanthelasma; normal ears, nose and oropharynx Neck: normal jugular venous pulsations and no hepatojugular reflux; brisk carotid pulses without delay and no carotid bruits Chest: clear to auscultation, no signs of consolidation by percussion or palpation, normal fremitus, symmetrical and full respiratory excursions Cardiovascular: normal position and quality of the apical impulse, regular rhythm, normal first and second heart sounds, no murmurs, rubs or gallops Abdomen: no tenderness or distention, no masses by palpation, no abnormal pulsatility or arterial bruits, normal bowel sounds, no hepatosplenomegaly Extremities: no clubbing, cyanosis or edema; 2+ radial, ulnar and brachial pulses bilaterally; 2+ right femoral, posterior tibial and dorsalis pedis pulses; 2+ left femoral, posterior tibial and dorsalis pedis pulses; no subclavian or  femoral bruits Neurological: grossly nonfocal   EKG: Normal sinus rhythm, inverted T waves in leads V3 through V6 this has been noted intermittently on previous tracings.  Lipid Panel  No results found for this basename: chol, trig, hdl, cholhdl, vldl, ldlcalc    BMET    Component Value Date/Time   NA 137 11/07/2012 1012   K 4.0 11/07/2012 1012   CL 102 11/07/2012 1012   CO2 28 11/07/2012 1012   GLUCOSE 96 11/07/2012 1012   BUN 8 11/07/2012 1012   CREATININE 1.0 11/07/2012 1012   CALCIUM 9.0 11/07/2012 1012     ASSESSMENT AND PLAN OSA (obstructive sleep apnea) I went into great detail about the importance of treating obstructive sleep apnea. We discussed the pathophysiological mechanisms  that may lead to injury to his heart and lungs as well as increased risk of accidents, stroke and cardiac arrhythmia. I strongly suspect is atrial flutter is related to untreated obstructive sleep apnea and told him that he may come back with atrial fibrillation in the future. He promises to make an appointment for the CPAP titration portion of his sleep study  Atrial flutter He has had successful caval tricuspid isthmus ablation. He remains at risk for atrial fibrillation in the future. Left atrium was mildly dilated by echo. He also has mild left ventricular hypertrophy with no other meaningful structural cardiac abnormalities. Dr.Allred stopped his beta blocker and anticoagulant at their recent appointment  Tobacco abuse 10 minutes were spent discussing the importance of smoking cessation. Also discussed different methods to assist with smoking cessation.  Obesity (BMI 30.0-34.9) Using weight is a very important long-term goal. Would lead to improvement in the sleep disorders and reduce the likelihood of future arrhythmia and other complications.  Orders Placed This Encounter  Procedures  . EKG 12-Lead   No orders of the defined types were placed in this encounter.    Junious Silk,  MD, [facility] [phone] office [phone] pager

## 2013-02-24 NOTE — Assessment & Plan Note (Signed)
Using weight is a very important long-term goal. Would lead to improvement in the sleep disorders and reduce the likelihood of future arrhythmia and other complications.

## 2013-02-24 NOTE — Assessment & Plan Note (Addendum)
He has had successful caval tricuspid isthmus ablation. He remains at risk for atrial fibrillation in the future. Left atrium was mildly dilated by echo. He also has mild left ventricular hypertrophy with no other meaningful structural cardiac abnormalities. Dr.Allred stopped his beta blocker and anticoagulant at their recent appointment

## 2013-04-09 ENCOUNTER — Other Ambulatory Visit: Payer: Self-pay | Admitting: Internal Medicine

## 2013-04-09 ENCOUNTER — Ambulatory Visit
Admission: RE | Admit: 2013-04-09 | Discharge: 2013-04-09 | Disposition: A | Discharge status: Home / Self Care | Payer: Self-pay | Source: Ambulatory Visit | Attending: Internal Medicine | Admitting: Internal Medicine

## 2013-04-09 DIAGNOSIS — M79609 Pain in unspecified limb: Secondary | ICD-10-CM

## 2013-07-23 ENCOUNTER — Other Ambulatory Visit: Payer: Self-pay | Admitting: Specialist

## 2013-07-23 ENCOUNTER — Ambulatory Visit
Admission: RE | Admit: 2013-07-23 | Discharge: 2013-07-23 | Disposition: A | Discharge status: Home / Self Care | Payer: Medicare Other | Source: Ambulatory Visit | Attending: Specialist | Admitting: Specialist

## 2013-07-23 DIAGNOSIS — T1490XA Injury, unspecified, initial encounter: Secondary | ICD-10-CM

## 2013-08-21 ENCOUNTER — Ambulatory Visit (INDEPENDENT_AMBULATORY_CARE_PROVIDER_SITE_OTHER): Payer: Medicare Other | Admitting: Cardiovascular Disease

## 2013-08-21 ENCOUNTER — Encounter: Payer: Self-pay | Admitting: Cardiovascular Disease

## 2013-08-21 VITALS — BP 130/88 | HR 74 | Resp 20 | Ht 72.0 in | Wt 257.2 lb

## 2013-08-21 DIAGNOSIS — E66811 Obesity, class 1: Secondary | ICD-10-CM

## 2013-08-21 DIAGNOSIS — F17200 Nicotine dependence, unspecified, uncomplicated: Secondary | ICD-10-CM

## 2013-08-21 DIAGNOSIS — E669 Obesity, unspecified: Secondary | ICD-10-CM

## 2013-08-21 DIAGNOSIS — G4733 Obstructive sleep apnea (adult) (pediatric): Secondary | ICD-10-CM

## 2013-08-21 DIAGNOSIS — I4892 Unspecified atrial flutter: Secondary | ICD-10-CM

## 2013-08-21 DIAGNOSIS — Z720 Tobacco use: Secondary | ICD-10-CM

## 2013-08-21 NOTE — Patient Instructions (Signed)
Your physician discussed the hazards of tobacco use. Tobacco use cessation is recommended and techniques and options to help you quit were discussed.   Your physician recommends that you schedule a follow-up appointment in: One year.

## 2013-08-23 ENCOUNTER — Telehealth: Payer: Self-pay | Admitting: Cardiovascular Disease

## 2013-08-23 NOTE — Telephone Encounter (Signed)
Message forwarded to Dr. Croitoru.  

## 2013-08-23 NOTE — Telephone Encounter (Signed)
Returned call.  Left message to call back before 4:30pm or tomorrow between 8am and 4pm.

## 2013-08-23 NOTE — Telephone Encounter (Signed)
Bupropion 150 mg daily for 3 days, then 150 mg BID for 3 months Start the medication 2 weeks before his planned quit date OK to also use nicotine patch/gum at the same time as this drug Wish him good luck!

## 2013-08-23 NOTE — Telephone Encounter (Signed)
Saw Dr Salena Saner on Tuesday,they talked about him getting something to help him stop smoking.Please call something in for him at [place].

## 2013-08-24 ENCOUNTER — Encounter: Payer: Self-pay | Admitting: Cardiovascular Disease

## 2013-08-24 MED ORDER — BUPROPION HCL ER (SR) 150 MG PO TB12: ORAL_TABLET | ORAL | Status: DC

## 2013-08-24 NOTE — Telephone Encounter (Signed)
Returned call and pt verified x 2.  Advice given per MD.  Pt verbalized understanding and agreed w/ plan.  Rx sent to pharmacy.

## 2013-08-24 NOTE — Assessment & Plan Note (Signed)
Had a long discussion regarding smoking cessation. I think is the single most useful intervention to preserve his lung and heart health. He was reluctant to make any commitments initially although he did mention that both he and his son went to quit smoking. After he left the office a call back and stated that he wanted to try to take bupropion to assist him with smoking cessation and that he plans to try quitting at the end of this year.

## 2013-08-24 NOTE — Progress Notes (Signed)
Patient ID: Miguel [name], male   DOB: 1951/04/27, 62 y.o.   MRN: [id number]     Reason for office visit Atrial flutter, obstructive sleep apnea  Miguel [name] is feeling well. It has been 9 months since his cava tricuspid isthmus ablation and has not had any recurrence of atrial tachyarrhythmia. He continues to smoke roughly half a pack of cigarettes a day. He is struggling to stay compliant with CPAP although he finds it a big hassle. He is mostly troubled by a really dry mouth when he wakes up in the morning. He does not have known coronary disease in his echocardiogram one year ago showed normal left ventricular systolic function and the absence of any serious valvular abnormalities. He sounds much more interested in smoking cessation although he found it hard to make a commitment to doing the visit today. Has noticed that the smoking leads to shortness of breath.    Allergies  Allergen Reactions  . Aspirin Shortness Of Breath and Nausea Only  . Tylenol [Acetaminophen] Shortness Of Breath and Nausea Only    Name brand only (causes sob and nausea)    Current Outpatient Prescriptions  Medication Sig Dispense Refill  . albuterol (PROAIR HFA) 108 (90 BASE) MCG/ACT inhaler Inhale 2 puffs into the lungs every 6 (six) hours as needed. For wheezing      . cetirizine (ZYRTEC) 10 MG tablet Take 10 mg by mouth daily as needed. For allergy symptoms      . DULoxetine (CYMBALTA) 30 MG capsule Take 30 mg by mouth at bedtime.      . DULoxetine (CYMBALTA) 60 MG capsule Take 60 mg by mouth every morning.       . Fluticasone-Salmeterol (ADVAIR) 250-50 MCG/DOSE AEPB Inhale 1 puff into the lungs 2 (two) times daily.      . metoprolol succinate (TOPROL-XL) 50 MG 24 hr tablet Take 50 mg by mouth daily. Take with or immediately following a meal.      . omeprazole (PRILOSEC) 40 MG capsule Take 40 mg by mouth daily.      Marland Kitchen buPROPion (WELLBUTRIN SR) 150 MG 12 hr tablet Take one tab daily for 3 days. Then take one  tab twice daily for 3 months. Start 2 weeks before you quit smoking.  63 tablet  2  . HYDROcodone-acetaminophen (NORCO) 10-325 MG per tablet Take by mouth 2 (two) times daily as needed.       No current facility-administered medications for this visit.    Past Medical History  Diagnosis Date  . Seasonal allergies   . Cat allergies     and dogs  . COPD (chronic obstructive pulmonary disease)   . Hyperlipidemia   . Depression   . GERD (gastroesophageal reflux disease)   . Avascular necrosis of bones of both hips     s/p bilateral hip replacement  . Arthritis   . Paroxysmal atrial flutter   . Hypertension   . OSA (obstructive sleep apnea)     Past Surgical History  Procedure Laterality Date  . Total hip arthroplasty      right and left  . Appendectomy    . Sinus surgery with instatrak      x3  . Hernia repair      x3  . Right hand surgery    . Tonsillectomy    . Wrist arthroscopy with debridement  09/27/2012    Procedure: WRIST ARTHROSCOPY WITH DEBRIDEMENT;  Surgeon: Sharma Covert, MD;  Location: [facility];  Service: Orthopedics;  Laterality: Left;  Left Wrist Arthroscopy and Debridement of Triangular Cartilege Complex Tear and Lunotriquetral ligament,  Exploration of Sixth Dorsal Compartment and Repair  . Atrial flutter ablation  11/14/12    CTI ablation by Dr Johney Frame    Family History  Problem Relation Age of Onset  . Breast cancer Sister   . Emphysema Mother   . Arthritis Mother   . Diabetes Sister   . Cancer Sister   . Diabetes Father   . Asthma Mother     History   Social History  . Marital Status: Married    Spouse Name: N/A    Number of Children: 2  . 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 Smoking status: Current Every Day Smoker -- 0.50 packs/day    Types: Cigarettes  . Smokeless tobacco: Never Used     Comment: Started smoking at age 62.  Currently smoking 1/2 ppd.   . Alcohol Use: Yes     Comment: beer (1-2 cans per day), remote heavy ETOH  . Drug Use: No  . Sexual Activity: Not on file     Comment: trying to cut down smoking   Other Topics Concern  . 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    Review of systems: The patient specifically denies any chest pain at rest or with exertion, dyspnea at rest or with exertion, orthopnea, paroxysmal nocturnal dyspnea, syncope, palpitations, focal neurological deficits, intermittent claudication, lower extremity edema, unexplained weight gain, cough, hemoptysis or wheezing.  The patient also denies abdominal pain, nausea, vomiting, dysphagia, diarrhea, constipation, polyuria, polydipsia, dysuria, hematuria, frequency, urgency, abnormal bleeding or bruising, fever, chills, unexpected weight changes, mood swings, change in skin or hair texture, change in voice quality, auditory or visual problems, allergic reactions or rashes, new musculoskeletal complaints other than usual "aches and pains".   PHYSICAL EXAM BP 130/88  Pulse 74  Resp 20  Ht 6' (1.829 m)  Wt 257 lb 3.2 oz (116.665 kg)  BMI 34.87 kg/m2 The patient specifically denies any chest pain at rest or with exertion, dyspnea at rest or with exertion, orthopnea, paroxysmal nocturnal dyspnea, syncope, palpitations, focal neurological deficits, intermittent claudication, lower extremity edema, unexplained weight gain, cough, hemoptysis or wheezing.  The patient also denies abdominal pain, nausea, vomiting, dysphagia, diarrhea, constipation, polyuria, polydipsia, dysuria, hematuria, frequency, urgency, abnormal bleeding or bruising, fever, chills, unexpected weight changes, mood swings, change in skin or hair texture, change in voice quality, auditory or visual problems, allergic reactions or rashes, new musculoskeletal complaints other than usual "aches and pains".   EKG: Sinus rhythm with  a couple of PACs   BMET    Component Value Date/Time   NA 137 11/07/2012 1012   K 4.0 11/07/2012 1012   CL 102 11/07/2012 1012   CO2 28 11/07/2012 1012   GLUCOSE 96 11/07/2012 1012   BUN 8 11/07/2012 1012   CREATININE 1.0 11/07/2012 1012   CALCIUM 9.0 11/07/2012 1012     ASSESSMENT AND PLAN Tobacco abuse Had a long discussion regarding smoking cessation. I think is the single most useful intervention to preserve his lung and heart health. He was reluctant to make any commitments initially although he did mention that both he and his son went to quit smoking. After he left the office a call  back and stated that he wanted to try to take bupropion to assist him with smoking cessation and that he plans to try quitting at the end of this year.  Atrial flutter So far he seems to have had a complete and durable response to atrial flutter ablation. He no longer requires Xarelto.  Obesity (BMI 30.0-34.9) In the long run it will be important for him to try to lose weight, but for the time being I would focus on smoking cessation. Did discuss healthy changes in diet including reduction in the overall intake of carbohydrates/starches a low glycemic index and saturated fats and increasing the intake of protein and unsaturated fat  OSA (obstructive sleep apnea) Encouraged compliance with CPAP.    Orders Placed This Encounter  Procedures  . EKG 12-Lead   Meds ordered this encounter  Medications  . HYDROcodone-acetaminophen (NORCO) 10-325 MG per tablet    Sig: Take by mouth 2 (two) times daily as needed.  . DULoxetine (CYMBALTA) 30 MG capsule    Sig: Take 30 mg by mouth at bedtime.    Junious Silk, MD, [facility] [phone] office [phone] pager

## 2013-08-24 NOTE — Assessment & Plan Note (Signed)
In the long run it will be important for him to try to lose weight, but for the time being I would focus on smoking cessation. Did discuss healthy changes in diet including reduction in the overall intake of carbohydrates/starches a low glycemic index and saturated fats and increasing the intake of protein and unsaturated fat

## 2013-08-24 NOTE — Assessment & Plan Note (Signed)
So far he seems to have had a complete and durable response to atrial flutter ablation. He no longer requires Xarelto.

## 2013-08-24 NOTE — Assessment & Plan Note (Signed)
Encouraged compliance with CPAP. 

## 2014-08-12 ENCOUNTER — Ambulatory Visit (INDEPENDENT_AMBULATORY_CARE_PROVIDER_SITE_OTHER): Payer: Medicare HMO | Admitting: Cardiovascular Disease

## 2014-08-12 ENCOUNTER — Encounter: Payer: Self-pay | Admitting: Cardiovascular Disease

## 2014-08-12 VITALS — BP 134/82 | HR 84 | Resp 16 | Ht 72.0 in | Wt 261.4 lb

## 2014-08-12 DIAGNOSIS — I4892 Unspecified atrial flutter: Secondary | ICD-10-CM

## 2014-08-12 DIAGNOSIS — E7800 Pure hypercholesterolemia, unspecified: Secondary | ICD-10-CM

## 2014-08-12 DIAGNOSIS — Z720 Tobacco use: Secondary | ICD-10-CM

## 2014-08-12 DIAGNOSIS — G4733 Obstructive sleep apnea (adult) (pediatric): Secondary | ICD-10-CM
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
## 2014-08-12 NOTE — Patient Instructions (Signed)
Dr. Croitoru recommends that you schedule a follow-up appointment in: One year.   

## 2014-08-15 ENCOUNTER — Encounter (HOSPITAL_COMMUNITY): Payer: Self-pay | Admitting: Internal Medicine

## 2014-08-22 ENCOUNTER — Ambulatory Visit: Payer: Medicare Other | Admitting: Cardiovascular Disease

## 2015-02-10 ENCOUNTER — Other Ambulatory Visit: Payer: Self-pay | Admitting: Specialist

## 2015-02-10 ENCOUNTER — Ambulatory Visit
Admission: RE | Admit: 2015-02-10 | Discharge: 2015-02-10 | Disposition: A | Discharge status: Home / Self Care | Payer: Medicare HMO | Source: Ambulatory Visit | Attending: Specialist | Admitting: Specialist

## 2015-02-10 DIAGNOSIS — M25552 Pain in left hip: Principal | ICD-10-CM

## 2015-02-10 DIAGNOSIS — M25551 Pain in right hip: Secondary | ICD-10-CM

## 2015-03-12 ENCOUNTER — Telehealth: Payer: Self-pay | Admitting: Cardiovascular Disease

## 2015-03-12 ENCOUNTER — Ambulatory Visit (INDEPENDENT_AMBULATORY_CARE_PROVIDER_SITE_OTHER): Payer: Medicare HMO | Admitting: Cardiovascular Disease

## 2015-03-12 ENCOUNTER — Encounter: Payer: Self-pay | Admitting: Cardiovascular Disease

## 2015-03-12 VITALS — BP 132/78 | HR 73 | Ht 72.0 in | Wt 257.0 lb

## 2015-03-12 DIAGNOSIS — I639 Cerebral infarction, unspecified: Secondary | ICD-10-CM

## 2015-03-12 DIAGNOSIS — G4733 Obstructive sleep apnea (adult) (pediatric): Secondary | ICD-10-CM

## 2015-03-12 DIAGNOSIS — E7800 Pure hypercholesterolemia, unspecified: Secondary | ICD-10-CM

## 2015-03-12 DIAGNOSIS — I4892 Unspecified atrial flutter: Secondary | ICD-10-CM | POA: Diagnosis not present

## 2015-03-12 NOTE — Telephone Encounter (Signed)
He is not taking Metoprolol. She was supposed to call back and let you know if he was taking it.Please take that off his medication list.

## 2015-03-12 NOTE — Patient Instructions (Addendum)
You will be scheduled for a Loop Recorder implant Tuesday March 18, 2015 at [facility].  This is an outpatient procedure.  You do NOT need to stop any medications for this procedure.

## 2015-03-13 ENCOUNTER — Other Ambulatory Visit: Payer: Self-pay | Admitting: *Deleted

## 2015-03-13 NOTE — Telephone Encounter (Signed)
Metoprolol taken off med list.

## 2015-03-14 NOTE — Progress Notes (Signed)
Patient ID: Miguel [name], male   DOB: 06/13/1951, 64 y.o.   MRN: [id number]     Cardiology Office Note   Date:  03/14/2015   ID:  Miguel [name], DOB 03/27/1951, MRN [id number]  PCP:  Michiel SitesWILLIAMS,DAVID G, MD  Cardiologist:   Thurmon FairROITORU,Jawaun Celmer, MD ; Hillis RangeJAMES ALLRED, MD  Chief Complaint  Patient presents with  . post hospital f/u for stroke    Patient has no complaints.      History of Present Illness: Miguel MillersDonnie L Sonnen is a 64 y.o. male who presents after recent hospitalization for [facility] early June. He presented with mild left hemiparesis and left facial droop and was discharged on both clopidogrel and Xarelto after imaging showed multiple infarcts in the right middle cerebral artery territory as well as the right posterior cerebral artery territory. His muscular weakness has virtually completely resolved.  :abs were unremarkable. His echocardiogram, performed June 9 showed : mild LVH with normal size left atrium and normal systolic function,  No significant valvular abnormalities.  It appears he did not have a transesophageal echocardiogram.   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 He has not had any palpitations since his ablation and atrial fibrillation has never been documented. He has a positive sleep study for sleep apnea but was unwilling to return for the CPAP titration segment, he has severe anxiety and claustrophobia and new he could not go through with it. For the same reason he does not think he'll be able to wear a CPAP device.  His neurologist Dr. Stacy GardnerLeanne Willis seems to have prevailed  upon him to get a repeat sleep study with hypnotic  Past Medical History  Diagnosis Date  . Seasonal allergies   . Cat allergies     and dogs  . COPD (chronic obstructive pulmonary disease)   . Hyperlipidemia   . Depression   . GERD (gastroesophageal reflux disease)   . Avascular necrosis of bones of both hips     s/p bilateral hip replacement  . Arthritis   . Paroxysmal atrial flutter   . Hypertension   . OSA (obstructive sleep apnea)     Past Surgical History  Procedure Laterality Date  . Total hip arthroplasty      right and left  . Appendectomy    . Sinus surgery with instatrak      x3  . Hernia repair      x3  . Right hand surgery    . Tonsillectomy    . Wrist arthroscopy with debridement  09/27/2012    Procedure: WRIST ARTHROSCOPY WITH DEBRIDEMENT;  Surgeon: Sharma CovertFred W Ortmann, MD;  Location: [facility];  Service: Orthopedics;  Laterality: Left;  Left Wrist Arthroscopy and Debridement of Triangular Cartilege Complex Tear and Lunotriquetral ligament,  Exploration of Sixth Dorsal Compartment and Repair  . Atrial flutter ablation  11/14/12    CTI ablation by Dr Johney FrameAllred  . Atrial flutter ablation N/A 11/14/2012    Procedure: ATRIAL FLUTTER ABLATION;  Surgeon: Hillis RangeJames Allred, MD;  [facility];  Service: Cardiovascular;  Laterality: N/A;     Current Outpatient Prescriptions  Medication Sig Dispense Refill  .  albuterol (PROAIR HFA) 108 (90 BASE) MCG/ACT inhaler Inhale 2 puffs into the lungs every 6 (six) hours as needed. For wheezing    . amLODipine (NORVASC) 2.5 MG tablet Take 2.5 mg by mouth daily.    Marland Kitchen. atorvastatin (LIPITOR) 80 MG tablet Take 80 mg by mouth daily.    . cetirizine (ZYRTEC) 10 MG tablet Take 10 mg by mouth daily as needed. For allergy symptoms    . clonazePAM (KLONOPIN) 0.5 MG tablet Take 5 mg by mouth as needed.    . clopidogrel (PLAVIX) 75 MG tablet Take 75 mg by mouth daily.    . DULoxetine (CYMBALTA) 30 MG capsule Take 30 mg by mouth  at bedtime.    . DULoxetine (CYMBALTA) 60 MG capsule Take 60 mg by mouth every morning.     . Fluticasone-Salmeterol (ADVAIR) 250-50 MCG/DOSE AEPB Inhale 1 puff into the lungs 2 (two) times daily.    Marland Kitchen. HYDROcodone-acetaminophen (NORCO) 10-325 MG per tablet Take by mouth 2 (two) times daily as needed.    Marland Kitchen. lisinopril (PRINIVIL,ZESTRIL) 10 MG tablet Take 10 mg by mouth daily.    Marland Kitchen. omeprazole (PRILOSEC) 40 MG capsule Take 40 mg by mouth daily.    . rivaroxaban (XARELTO) 20 MG TABS tablet Take 20 mg by mouth daily with supper.    . zolpidem (AMBIEN) 10 MG tablet Take 10 mg by mouth at bedtime as needed for sleep.     No current facility-administered medications for this visit.    Allergies:   Aspirin and Tylenol    Social History:  The patient  reports that he has been smoking Cigarettes.  He has been smoking about 0.50 packs per day. He has never used smokeless tobacco. He reports that he drinks alcohol. He reports that he does not use illicit drugs.   Family History:  The patient's family history includes Arthritis in his mother; Asthma in his mother; Breast cancer in his sister; Cancer in his sister; Diabetes in his father and sister; Emphysema in his mother.    ROS:  Please see the history of present illness.    Otherwise, review of systems positive for none.   All other systems are reviewed and negative.    PHYSICAL EXAM: VS:  BP 132/78 mmHg  Pulse 73  Ht 6' (1.829 m)  Wt 257 lb (116.574 kg)  BMI 34.85 kg/m2 , BMI Body mass index is 34.85 kg/(m^2).  General: Alert, oriented x3, no distress Head: no evidence of trauma, PERRL, EOMI, no exophtalmos or lid lag, no myxedema, no xanthelasma; normal ears, nose and oropharynx Neck: normal jugular venous pulsations and no hepatojugular reflux; brisk carotid pulses without delay and no carotid bruits Chest: clear to auscultation, no signs of consolidation by percussion or palpation, normal fremitus, symmetrical and full respiratory  excursions Cardiovascular: normal position and quality of the apical impulse, regular rhythm, normal first and second heart sounds, no murmurs, rubs or gallops Abdomen: no tenderness or distention, no masses by palpation, no abnormal pulsatility or arterial bruits, normal bowel sounds, no hepatosplenomegaly Extremities: no clubbing, cyanosis or edema; 2+ radial, ulnar and brachial pulses bilaterally; 2+ right femoral, posterior tibial and dorsalis pedis pulses; 2+ left femoral, posterior tibial and dorsalis pedis pulses; no subclavian or femoral bruits Neurological: grossly nonfocal Psych: euthymic mood, full affect   EKG:  EKG is ordered today. The ekg ordered today demonstrates  Sinus rhythm with first-degree AV block and frequent PACs and blocked PACs   Recent Labs:  [place] June 8  potassium 4. 3 , creatinine 0.96 , glucose 110, normal liver function tests , INR 0.92, hemoglobin A1c 5.6% , hemoglobin 16.6 , platelets 160 8K    Lipid Panel  [place] June 9 total cholesterol 178 , LDL 110, HDL 38, triglycerides 151    Wt Readings from Last 3 Encounters:  03/12/15 257 lb (116.574 kg)  08/12/14 261 lb 6.4 oz (118.57 kg)  08/21/13 257 lb 3.2 oz (116.665 kg)      Other studies Reviewed: Additional studies/ records that were reviewed today include:  [facility] including all imaging studies and echocardiogram and follow-up visit with neurology  ASSESSMENT AND PLAN:  1.   Cryptogenic stroke -  The obvious concern is recurrence of atrial arrhythmia with embolic potential , either atrial flutter or atrial fibrillation. Necessary substrate with COPD, untreated sleep apnea and obesity is present , but no arrhythmia has been documented since his caval tricuspid ablation. It is imperative that we have confidence that he does not have atrial arrhythmia and I recommended that he undergo implantation of a loop recorder.This procedure has been fully reviewed with the patient  and written informed consent has been obtained.  he is currently being aggressively treated with both antiplatelets and anticoagulation therapy, obviously with increased bleeding risk. If clear evidence of atrial arrhythmia is identified this can be treated and the antiplatelet agent might be stopped.  2.  History of atrial flutter status post caval tricuspid isthmus ablation  3.  Obesity and untreated obstructive sleep apnea with probable secondary cor pulmonale,  to undergo CPAP titration study  4.  History of smoking and probable COPD also contributing to pulmonary vascular disease.  He reports a firm decision to quit smoking  Current medicines are reviewed at length with the patient today.  The patient does not have concerns regarding medicines.  The following changes have been made:  no change  Labs/ tests ordered today include:  Orders Placed This Encounter  Procedures  . EKG 12-Lead  . LOOP RECORDER IMPLANT    Patient Instructions  You will be scheduled for a Loop Recorder implant Tuesday March 18, 2015 at [place] [facility].  This is an outpatient procedure.  You do NOT need to stop any medications for this procedure.     Signed, Kaydan Wong, MD  03/14/2015 9:30 PM    Shadow Schedler, MD, FACC [facility] [phone] office [phone] pager   

## 2015-03-18 ENCOUNTER — Ambulatory Visit (HOSPITAL_COMMUNITY)
Admission: RE | Admit: 2015-03-18 | Discharge: 2015-03-18 | Disposition: A | Discharge status: Home / Self Care | Payer: Medicare HMO | Source: Ambulatory Visit | Attending: Cardiovascular Disease | Admitting: Cardiovascular Disease

## 2015-03-18 ENCOUNTER — Encounter (HOSPITAL_COMMUNITY)
Admission: RE | Disposition: A | Discharge status: Home / Self Care | Payer: Self-pay | Source: Ambulatory Visit | Attending: Cardiovascular Disease

## 2015-03-18 ENCOUNTER — Encounter (HOSPITAL_COMMUNITY): Payer: Self-pay | Admitting: Cardiovascular Disease

## 2015-03-18 DIAGNOSIS — E669 Obesity, unspecified: Secondary | ICD-10-CM | POA: Insufficient documentation

## 2015-03-18 DIAGNOSIS — Z7951 Long term (current) use of inhaled steroids: Secondary | ICD-10-CM | POA: Diagnosis not present

## 2015-03-18 DIAGNOSIS — E785 Hyperlipidemia, unspecified: Secondary | ICD-10-CM | POA: Insufficient documentation

## 2015-03-18 DIAGNOSIS — J449 Chronic obstructive pulmonary disease, unspecified: Secondary | ICD-10-CM | POA: Insufficient documentation

## 2015-03-18 DIAGNOSIS — Z6833 Body mass index (BMI) 33.0-33.9, adult: Secondary | ICD-10-CM | POA: Diagnosis not present

## 2015-03-18 DIAGNOSIS — Z8673 Personal history of transient ischemic attack (TIA), and cerebral infarction without residual deficits: Secondary | ICD-10-CM

## 2015-03-18 DIAGNOSIS — M1990 Unspecified osteoarthritis, unspecified site: Secondary | ICD-10-CM | POA: Insufficient documentation

## 2015-03-18 DIAGNOSIS — Z7901 Long term (current) use of anticoagulants: Secondary | ICD-10-CM | POA: Insufficient documentation

## 2015-03-18 DIAGNOSIS — I639 Cerebral infarction, unspecified: Secondary | ICD-10-CM | POA: Diagnosis not present

## 2015-03-18 DIAGNOSIS — I10 Essential (primary) hypertension: Secondary | ICD-10-CM | POA: Diagnosis not present

## 2015-03-18 DIAGNOSIS — K219 Gastro-esophageal reflux disease without esophagitis: Secondary | ICD-10-CM | POA: Insufficient documentation

## 2015-03-18 DIAGNOSIS — G4733 Obstructive sleep apnea (adult) (pediatric): Secondary | ICD-10-CM | POA: Diagnosis not present

## 2015-03-18 DIAGNOSIS — J302 Other seasonal allergic rhinitis: Secondary | ICD-10-CM | POA: Diagnosis not present

## 2015-03-18 DIAGNOSIS — F329 Major depressive disorder, single episode, unspecified: Secondary | ICD-10-CM | POA: Diagnosis not present

## 2015-03-18 DIAGNOSIS — Z7902 Long term (current) use of antithrombotics/antiplatelets: Secondary | ICD-10-CM | POA: Insufficient documentation

## 2015-03-18 DIAGNOSIS — Z79899 Other long term (current) drug therapy: Secondary | ICD-10-CM | POA: Diagnosis not present

## 2015-03-18 DIAGNOSIS — Z96643 Presence of artificial hip joint, bilateral: Secondary | ICD-10-CM | POA: Insufficient documentation

## 2015-03-18 DIAGNOSIS — F17210 Nicotine dependence, cigarettes, uncomplicated: Secondary | ICD-10-CM | POA: Insufficient documentation

## 2015-03-18 HISTORY — PX: EP IMPLANTABLE DEVICE: SHX172B

## 2015-03-18 SURGERY — LOOP RECORDER INSERTION
Anesthesia: LOCAL

## 2015-03-18 MED ORDER — LIDOCAINE-EPINEPHRINE 1 %-1:100000 IJ SOLN
INTRAMUSCULAR | Status: DC | PRN
  Administered 2015-03-18: 10 mL

## 2015-03-18 MED ORDER — LIDOCAINE-EPINEPHRINE 1 %-1:100000 IJ SOLN
INTRAMUSCULAR | Status: AC
  Filled 2015-03-18: qty 1

## 2015-03-18 SURGICAL SUPPLY — 2 items
LOOP REVEAL LINQSYS (Prosthesis & Implant Heart) ×2 IMPLANT
PACK LOOP INSERTION (CUSTOM PROCEDURE TRAY) ×2 IMPLANT

## 2015-03-18 NOTE — H&P (View-Only) (Signed)
Patient ID: Miguel [name], male   DOB: 06/13/1951, 64 y.o.   MRN: [id number]     Cardiology Office Note   Date:  03/14/2015   ID:  Miguel [name], DOB 03/27/1951, MRN [id number]  PCP:  Michiel SitesWILLIAMS,DAVID G, MD  Cardiologist:   Thurmon FairROITORU,Ariana Juul, MD ; Hillis RangeJAMES ALLRED, MD  Chief Complaint  Patient presents with  . post hospital f/u for stroke    Patient has no complaints.      History of Present Illness: Miguel [name] is a 64 y.o. male who presents after recent hospitalization for stroke  [facility] in early June. He presented with mild left hemiparesis and left facial droop and was discharged on both clopidogrel and Xarelto after imaging showed multiple infarcts in the right middle cerebral artery territory as well as the right posterior cerebral artery territory. His muscular weakness has virtually completely resolved.  :abs were unremarkable. His echocardiogram, performed June 9 showed : mild LVH with normal size left atrium and normal systolic function,  No significant valvular abnormalities.  It appears he did not have a transesophageal echocardiogram.   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 He has not had any palpitations since his ablation and atrial fibrillation has never been documented. He has a positive sleep study for sleep apnea but was unwilling to return for the CPAP titration segment, he has severe anxiety and claustrophobia and new he could not go through with it. For the same reason he does not think he'll be able to wear a CPAP device.  His neurologist Dr. Stacy GardnerLeanne Willis seems to have prevailed  upon him to get a repeat sleep study with hypnotic  Past Medical History  Diagnosis Date  . Seasonal allergies   . Cat allergies     and dogs  . COPD (chronic obstructive pulmonary disease)   . Hyperlipidemia   . Depression   . GERD (gastroesophageal reflux disease)   . Avascular necrosis of bones of both hips     s/p bilateral hip replacement  . Arthritis   . Paroxysmal atrial flutter   . Hypertension   . OSA (obstructive sleep apnea)     Past Surgical History  Procedure Laterality Date  . Total hip arthroplasty      right and left  . Appendectomy    . Sinus surgery with instatrak      x3  . Hernia repair      x3  . Right hand surgery    . Tonsillectomy    . Wrist arthroscopy with debridement  09/27/2012    Procedure: WRIST ARTHROSCOPY WITH DEBRIDEMENT;  Surgeon: Sharma CovertFred W Ortmann, MD;  Location: [facility];  Service: Orthopedics;  Laterality: Left;  Left Wrist Arthroscopy and Debridement of Triangular Cartilege Complex Tear and Lunotriquetral ligament,  Exploration of Sixth Dorsal Compartment and Repair  . Atrial flutter ablation  11/14/12    CTI ablation by Dr Johney FrameAllred  . Atrial flutter ablation N/A 11/14/2012    Procedure: ATRIAL FLUTTER ABLATION;  Surgeon: Hillis RangeJames Allred, MD;  [facility];  Service: Cardiovascular;  Laterality: N/A;     Current Outpatient Prescriptions  Medication Sig Dispense Refill  .  albuterol (PROAIR HFA) 108 (90 BASE) MCG/ACT inhaler Inhale 2 puffs into the lungs every 6 (six) hours as needed. For wheezing    . amLODipine (NORVASC) 2.5 MG tablet Take 2.5 mg by mouth daily.    Marland Kitchen. atorvastatin (LIPITOR) 80 MG tablet Take 80 mg by mouth daily.    . cetirizine (ZYRTEC) 10 MG tablet Take 10 mg by mouth daily as needed. For allergy symptoms    . clonazePAM (KLONOPIN) 0.5 MG tablet Take 5 mg by mouth as needed.    . clopidogrel (PLAVIX) 75 MG tablet Take 75 mg by mouth daily.    . DULoxetine (CYMBALTA) 30 MG capsule Take 30 mg by mouth  at bedtime.    . DULoxetine (CYMBALTA) 60 MG capsule Take 60 mg by mouth every morning.     . Fluticasone-Salmeterol (ADVAIR) 250-50 MCG/DOSE AEPB Inhale 1 puff into the lungs 2 (two) times daily.    Marland Kitchen. HYDROcodone-acetaminophen (NORCO) 10-325 MG per tablet Take by mouth 2 (two) times daily as needed.    Marland Kitchen. lisinopril (PRINIVIL,ZESTRIL) 10 MG tablet Take 10 mg by mouth daily.    Marland Kitchen. omeprazole (PRILOSEC) 40 MG capsule Take 40 mg by mouth daily.    . rivaroxaban (XARELTO) 20 MG TABS tablet Take 20 mg by mouth daily with supper.    . zolpidem (AMBIEN) 10 MG tablet Take 10 mg by mouth at bedtime as needed for sleep.     No current facility-administered medications for this visit.    Allergies:   Aspirin and Tylenol    Social History:  The patient  reports that he has been smoking Cigarettes.  He has been smoking about 0.50 packs per day. He has never used smokeless tobacco. He reports that he drinks alcohol. He reports that he does not use illicit drugs.   Family History:  The patient's family history includes Arthritis in his mother; Asthma in his mother; Breast cancer in his sister; Cancer in his sister; Diabetes in his father and sister; Emphysema in his mother.    ROS:  Please see the history of present illness.    Otherwise, review of systems positive for none.   All other systems are reviewed and negative.    PHYSICAL EXAM: VS:  BP 132/78 mmHg  Pulse 73  Ht 6' (1.829 m)  Wt 257 lb (116.574 kg)  BMI 34.85 kg/m2 , BMI Body mass index is 34.85 kg/(m^2).  General: Alert, oriented x3, no distress Head: no evidence of trauma, PERRL, EOMI, no exophtalmos or lid lag, no myxedema, no xanthelasma; normal ears, nose and oropharynx Neck: normal jugular venous pulsations and no hepatojugular reflux; brisk carotid pulses without delay and no carotid bruits Chest: clear to auscultation, no signs of consolidation by percussion or palpation, normal fremitus, symmetrical and full respiratory  excursions Cardiovascular: normal position and quality of the apical impulse, regular rhythm, normal first and second heart sounds, no murmurs, rubs or gallops Abdomen: no tenderness or distention, no masses by palpation, no abnormal pulsatility or arterial bruits, normal bowel sounds, no hepatosplenomegaly Extremities: no clubbing, cyanosis or edema; 2+ radial, ulnar and brachial pulses bilaterally; 2+ right femoral, posterior tibial and dorsalis pedis pulses; 2+ left femoral, posterior tibial and dorsalis pedis pulses; no subclavian or femoral bruits Neurological: grossly nonfocal Psych: euthymic mood, full affect   EKG:  EKG is ordered today. The ekg ordered today demonstrates  Sinus rhythm with first-degree AV block and frequent PACs and blocked PACs   Recent Labs:  [facility] June 8  potassium 4. 3 , creatinine 0.96 , glucose 110, normal liver function tests , INR 0.92, hemoglobin A1c 5.6% , hemoglobin 16.6 , platelets 160 8K    Lipid Panel  [place] June 9 total cholesterol 178 , LDL 110, HDL 38, triglycerides 151    Wt Readings from Last 3 Encounters:  03/12/15 257 lb (116.574 kg)  08/12/14 261 lb 6.4 oz (118.57 kg)  08/21/13 257 lb 3.2 oz (116.665 kg)      Other studies Reviewed: Additional studies/ records that were reviewed today include:  [facility] including all imaging studies and echocardiogram and follow-up visit with neurology  ASSESSMENT AND PLAN:  1.   Cryptogenic stroke -  The obvious concern is recurrence of atrial arrhythmia with embolic potential , either atrial flutter or atrial fibrillation. Necessary substrate with COPD, untreated sleep apnea and obesity is present , but no arrhythmia has been documented since his caval tricuspid ablation. It is imperative that we have confidence that he does not have atrial arrhythmia and I recommended that he undergo implantation of a loop recorder.This procedure has been fully reviewed with the patient  and written informed consent has been obtained.  he is currently being aggressively treated with both antiplatelets and anticoagulation therapy, obviously with increased bleeding risk. If clear evidence of atrial arrhythmia is identified this can be treated and the antiplatelet agent might be stopped.  2.  History of atrial flutter status post caval tricuspid isthmus ablation  3.  Obesity and untreated obstructive sleep apnea with probable secondary cor pulmonale,  to undergo CPAP titration study  4.  History of smoking and probable COPD also contributing to pulmonary vascular disease.  He reports a firm decision to quit smoking  Current medicines are reviewed at length with the patient today.  The patient does not have concerns regarding medicines.  The following changes have been made:  no change  Labs/ tests ordered today include:  Orders Placed This Encounter  Procedures  . EKG 12-Lead  . LOOP RECORDER IMPLANT    Patient Instructions  You will be scheduled for a Loop Recorder implant Tuesday March 18, 2015 at [facility].  This is an outpatient procedure.  You do NOT need to stop any medications for this procedure.     Joie Bimler, MD  03/14/2015 9:30 PM    Thurmon Fair, MD, [facility] [phone] office [phone] pager

## 2015-03-18 NOTE — Op Note (Signed)
LOOP RECORDER IMPLANT   Procedure report  Procedure performed:  Loop recorder implantation   Reason for procedure:  1. Cryptogenic stroke Procedure performed by:  Thurmon FairMihai Quatavious Rossa, MD  Complications:  None  Estimated blood loss:  <5 mL  Medications administered during procedure:  Lidocaine 1% with 1/10,000 epinephrine 10 mL locally Device details:  Medtronic Reveal Linq model number [id number], serial number [id number] S Procedure details:  After the risks and benefits of the procedure were discussed the patient provided informed consent. The patient was prepped and draped in usual sterile fashion. Local anesthesia was administered to an area 2 cm to the left of the sternum in the 4th intercostal space. A cutaneous incision was made using the incision tool. The introducer was then used to create a subcutaneous tunnel and carefully deploy the device. Local pressure was held to ensure hemostasis.  The incision was closed with SteriStrips and a sterile dressing was applied.  R waves 0.25V. Thurmon FairMihai Younis Mathey, MD, [facility] [phone] office [phone] pager 03/18/2015 2:25 PM

## 2015-03-18 NOTE — Interval H&P Note (Signed)
History and Physical Interval Note:  03/18/2015 1:38 PM  Taiwo L Cordy  has presented today for surgery, with the diagnosis of cryptogenic stroke  The various methods of treatment have been discussed with the patient and family. After consideration of risks, benefits and other options for treatment, the patient has consented to  Procedure(s): Loop Recorder Insertion (N/A) as a surgical intervention .  The patient's history has been reviewed, patient examined, no change in status, stable for surgery.  I have reviewed the patient's chart and labs.  Questions were answered to the patient's satisfaction.     Chelsa Stout

## 2015-03-19 ENCOUNTER — Telehealth: Payer: Self-pay | Admitting: *Deleted

## 2015-03-19 NOTE — Telephone Encounter (Signed)
LMOVM for return call. LINQ alert for AF. Pt needs to send manuel transmission.

## 2015-03-21 NOTE — Telephone Encounter (Signed)
Spoke w/pt and instructed on how to send manual transmission. AF burden 26.7% of time. Pt is already on Xarelto and Plavix. Strips in LINQ folder.

## 2015-03-24 ENCOUNTER — Telehealth: Payer: Self-pay | Admitting: Cardiovascular Disease

## 2015-03-25 NOTE — Telephone Encounter (Signed)
Closed encounter °

## 2015-03-27 ENCOUNTER — Ambulatory Visit (INDEPENDENT_AMBULATORY_CARE_PROVIDER_SITE_OTHER): Payer: Medicare HMO | Admitting: *Deleted

## 2015-03-27 DIAGNOSIS — I639 Cerebral infarction, unspecified: Secondary | ICD-10-CM

## 2015-03-27 DIAGNOSIS — I6350 Cerebral infarction due to unspecified occlusion or stenosis of unspecified cerebral artery: Secondary | ICD-10-CM

## 2015-03-27 LAB — CUP PACEART INCLINIC DEVICE CHECK: Date Time Interrogation Session: 20160721143314

## 2015-03-27 NOTE — Progress Notes (Signed)
LINQ wound check in clinic. Site well healed with no redness or swelling. Battery status good. Normal device function. Rwave measurement 0.34mV. 1 tachy episode and 193 AF episodes--14% of time. Episodes previously reviewed. + Xarelto. Time correct and Carelink monitor sending daily transmissions. Monthly summary reports and ROV in 3 mths w/[facility].

## 2015-04-14 ENCOUNTER — Telehealth: Payer: Self-pay | Admitting: *Deleted

## 2015-04-14 NOTE — Telephone Encounter (Signed)
Informed patient that per [facility] he can D/C his plavix bc there is plenty of evidence that he has PAF, which more than likely caused the CVA. Patient voiced understanding. Patient does not have an appt to f/u with [facility]. Will defer scheduling to [facility].

## 2015-04-14 NOTE — Addendum Note (Signed)
Addended by: Sebastian Ache on: 04/14/2015 04:56 PM   Modules accepted: Orders, Medications

## 2015-04-17 ENCOUNTER — Encounter: Payer: Self-pay | Admitting: Cardiovascular Disease

## 2015-04-17 ENCOUNTER — Ambulatory Visit (INDEPENDENT_AMBULATORY_CARE_PROVIDER_SITE_OTHER): Payer: Medicare HMO | Admitting: *Deleted

## 2015-04-17 DIAGNOSIS — I6350 Cerebral infarction due to unspecified occlusion or stenosis of unspecified cerebral artery: Secondary | ICD-10-CM

## 2015-04-17 DIAGNOSIS — I639 Cerebral infarction, unspecified: Secondary | ICD-10-CM | POA: Diagnosis not present

## 2015-04-18 LAB — CUP PACEART REMOTE DEVICE CHECK: MDC IDC SESS DTM: 20160811040500

## 2015-04-22 NOTE — Progress Notes (Signed)
Loop recorder 

## 2015-04-24 ENCOUNTER — Encounter: Payer: Self-pay | Admitting: Cardiovascular Disease

## 2015-04-24 ENCOUNTER — Encounter: Payer: Self-pay | Admitting: *Deleted

## 2015-04-24 NOTE — Progress Notes (Signed)
Late entry from 04/21/15:  AF episodes from 04/17/15, 8/12, 8/14, and 8/15 printed for review.  Episodes show a combination of AF and oversensing, +Xarelto, AF burden 8.7%. Tachy episode from 04/19/15 printed for review.  EGM suggests SVT, cycle lengths 340ms-340ms.  Monthly summary reports and ROV with [facility] on 06/30/2015 at 9:00am.

## 2015-04-24 NOTE — Progress Notes (Signed)
Tachy episode from 8/16 and AF episode from 8/17 printed for review. Tachy episode suggests SVT, cycle lengths 372ms-340ms. AF episode suggests ?AFl vs. SR w/PACs, +Xarelto. Monthly summary reports and ROV with [facility] on 06/30/2015 at 9:00am.

## 2015-05-01 ENCOUNTER — Encounter: Payer: Self-pay | Admitting: Cardiovascular Disease

## 2015-05-16 ENCOUNTER — Ambulatory Visit (INDEPENDENT_AMBULATORY_CARE_PROVIDER_SITE_OTHER): Payer: Medicare HMO | Admitting: *Deleted

## 2015-05-16 DIAGNOSIS — I639 Cerebral infarction, unspecified: Secondary | ICD-10-CM | POA: Diagnosis not present

## 2015-05-16 DIAGNOSIS — I6350 Cerebral infarction due to unspecified occlusion or stenosis of unspecified cerebral artery: Secondary | ICD-10-CM

## 2015-05-17 ENCOUNTER — Encounter: Payer: Self-pay | Admitting: Cardiovascular Disease

## 2015-05-19 ENCOUNTER — Encounter: Payer: Self-pay | Admitting: Cardiovascular Disease

## 2015-05-19 NOTE — Progress Notes (Signed)
Loop recorder 

## 2015-05-20 ENCOUNTER — Encounter: Payer: Self-pay | Admitting: Cardiovascular Disease

## 2015-06-13 LAB — CUP PACEART REMOTE DEVICE CHECK: MDC IDC SESS DTM: 20160910180553

## 2015-06-13 NOTE — Progress Notes (Signed)
Carelink summary report received. Battery status OK. Normal device function. No new symptom episodes, brady, or pause episodes. 9 tachy episodes---mostly appropriate, 1 EGM shows noise. 27.8% AF + xarelto. Monthly summary reports and ROV w/ [facility] 06/30/15.

## 2015-06-16 ENCOUNTER — Ambulatory Visit (INDEPENDENT_AMBULATORY_CARE_PROVIDER_SITE_OTHER): Payer: Medicare HMO | Admitting: *Deleted

## 2015-06-16 DIAGNOSIS — I639 Cerebral infarction, unspecified: Secondary | ICD-10-CM

## 2015-06-18 NOTE — Progress Notes (Signed)
Loop recorder 

## 2015-06-23 ENCOUNTER — Encounter: Payer: Self-pay | Admitting: Cardiovascular Disease

## 2015-06-30 ENCOUNTER — Ambulatory Visit (INDEPENDENT_AMBULATORY_CARE_PROVIDER_SITE_OTHER): Payer: Medicare HMO | Admitting: Cardiovascular Disease

## 2015-06-30 ENCOUNTER — Encounter: Payer: Self-pay | Admitting: Cardiovascular Disease

## 2015-06-30 VITALS — BP 122/72 | HR 76 | Resp 16 | Ht 72.0 in | Wt 260.0 lb

## 2015-06-30 DIAGNOSIS — I480 Paroxysmal atrial fibrillation: Secondary | ICD-10-CM

## 2015-06-30 DIAGNOSIS — I4892 Unspecified atrial flutter: Secondary | ICD-10-CM | POA: Diagnosis not present

## 2015-06-30 DIAGNOSIS — Z720 Tobacco use: Secondary | ICD-10-CM

## 2015-06-30 DIAGNOSIS — G4733 Obstructive sleep apnea (adult) (pediatric): Secondary | ICD-10-CM

## 2015-06-30 DIAGNOSIS — E669 Obesity, unspecified: Secondary | ICD-10-CM

## 2015-06-30 DIAGNOSIS — I4821 Permanent atrial fibrillation: Secondary | ICD-10-CM | POA: Insufficient documentation

## 2015-06-30 MED ORDER — RIVAROXABAN 20 MG PO TABS: 20.0000 mg | ORAL_TABLET | Freq: Every day | ORAL | Status: DC

## 2015-06-30 NOTE — Patient Instructions (Signed)
Dr. Croitoru recommends that you schedule a follow-up appointment in: 6 MONTHS   

## 2015-06-30 NOTE — Progress Notes (Signed)
Patient ID: Miguel [name], male   DOB: 1951-06-02, 64 y.o.   MRN: [id number]     Cardiology Office Note   Date:  06/30/2015   ID:  Miguel [name], DOB 1951-05-30, MRN [id number]  PCP:  Michiel Sites, MD  Cardiologist:   Thurmon Fair, MD   Chief Complaint  Patient presents with  . Annual Exam  . Shortness of Breath  . Dizziness      History of Present Illness: Miguel [name] is a 64 y.o. male who presents for  Follow-up for atrial fibrillation.    [name] had an episode of very brief syncope (1 or 2 seconds) on Saturday , following a "very strong coughing episode". He has had similar episodes of cough syncope in the past. Interrogation of his loop recorder does not show any evidence of significant bradycardia or tachycardia around that time.  He had successful ablation for atrial flutter several years ago and then presented with stroke (June 2016, left hemiparesis, multiple infarcts in the right middle cerebral artery and right posterior cerebral artery distribution). An implanted loop recorder shows very frequent episodes of completely asymptomatic atrial fibrillation with controlled ventricular rate. In fact on device check today the overall burden of atrial fibrillation since device implantation is 23%.  Unfortunately he continues to  "cheat" on his smoking. He has quit before , but a lot of recent emotional problems have interfered. He has a history of COPD. He also had a positive sleep study for sleep apnea, but is unable to afford the co-pay required to obtain the necessary equipment. He also has long-standing worries that his anxiety and claustrophobia will prevent him from wearing the mask.    Past Medical History  Diagnosis Date  . Seasonal allergies   . Cat allergies     and dogs  . COPD (chronic obstructive pulmonary disease) (HCC)   . Hyperlipidemia   . Depression   . GERD (gastroesophageal reflux disease)   . Avascular necrosis of bones of both hips (HCC)       s/p bilateral hip replacement  . Arthritis   . Paroxysmal atrial flutter (HCC)   . Hypertension   . OSA (obstructive sleep apnea)     Past Surgical History  Procedure Laterality Date  . Total hip arthroplasty      right and left  . Appendectomy    . Sinus surgery with instatrak      x3  . Hernia repair      x3  . Right hand surgery    . Tonsillectomy    . Wrist arthroscopy with debridement  09/27/2012    Procedure: WRIST ARTHROSCOPY WITH DEBRIDEMENT;  Surgeon: Sharma Covert, MD;  Location: [facility];  Service: Orthopedics;  Laterality: Left;  Left Wrist Arthroscopy and Debridement of Triangular Cartilege Complex Tear and Lunotriquetral ligament,  Exploration of Sixth Dorsal Compartment and Repair  . Atrial flutter ablation  11/14/12    CTI ablation by Dr Johney Frame  . Atrial flutter ablation N/A 11/14/2012    Procedure: ATRIAL FLUTTER ABLATION;  Surgeon: Hillis Range, MD;  Location[facility];  Service: Cardiovascular;  Laterality: N/A;  . Ep implantable device N/A 03/18/2015    Procedure: Loop Recorder Insertion;  Surgeon: Thurmon Fair, MD;  [facility]: [facility];  Service: Cardiovascular;  Laterality: N/A;     Current Outpatient Prescriptions  Medication Sig Dispense Refill  . albuterol (PROAIR HFA) 108 (90 BASE) MCG/ACT inhaler Inhale 2 puffs into the lungs every  6 (six) hours as needed. For wheezing    . amLODipine (NORVASC) 2.5 MG tablet Take 2.5 mg by mouth daily.    Miguel [name]. atorvastatin (LIPITOR) 80 MG tablet Take 80 mg by mouth daily.    . cetirizine (ZYRTEC) 10 MG tablet Take 10 mg by mouth daily as needed. For allergy symptoms    . clonazePAM (KLONOPIN) 0.5 MG tablet Take 5 mg by mouth as needed.    . DULoxetine (CYMBALTA) 30 MG capsule Take 30 mg by mouth at bedtime.    . DULoxetine (CYMBALTA) 60 MG capsule Take 60 mg by mouth every morning.     . Fluticasone-Salmeterol (ADVAIR) 250-50 MCG/DOSE AEPB Inhale 1 puff into the lungs 2 (two) times daily.     Miguel [name]. HYDROcodone-acetaminophen (NORCO) 10-325 MG per tablet Take by mouth 2 (two) times daily as needed.    Miguel [name]. lisinopril (PRINIVIL,ZESTRIL) 10 MG tablet Take 10 mg by mouth daily.    Miguel [name]. omeprazole (PRILOSEC) 40 MG capsule Take 40 mg by mouth daily.    . rivaroxaban (XARELTO) 20 MG TABS tablet Take 1 tablet (20 mg total) by mouth daily with supper. 28 tablet 0  . zolpidem (AMBIEN) 10 MG tablet Take 10 mg by mouth at bedtime as needed for sleep.     No current facility-administered medications for this visit.    Allergies:   Aspirin and Tylenol    Social History:  The patient  reports that he has been smoking Cigarettes.  He has been smoking about 0.50 packs per day. He has never used smokeless tobacco. He reports that he drinks alcohol. He reports that he does not use illicit drugs.   Family History:  The patient's family history includes Arthritis in his mother; Asthma in his mother; Breast cancer in his sister; Cancer in his sister; Diabetes in his father and sister; Emphysema in his mother.    ROS:  Please see the history of present illness.    Otherwise, review of systems positive for  Chronic cough, NYHA functional class II-III dyspnea , anxiety , fatigue.   All other systems are reviewed and negative.    PHYSICAL EXAM: VS:  BP 122/72 mmHg  Pulse 76  Resp 16  Ht 6' (1.829 m)  Wt 260 lb (117.935 kg)  BMI 35.25 kg/m2 , BMI Body mass index is 35.25 kg/(m^2).  General: Alert, oriented x3, no distress Head: no evidence of trauma, PERRL, EOMI, no exophtalmos or lid lag, no myxedema, no xanthelasma; normal ears, nose and oropharynx Neck: normal jugular venous pulsations and no hepatojugular reflux; brisk carotid pulses without delay and no carotid bruits Chest:  Diminished breath sounds throughout, but otherwise clear to auscultation, no signs of consolidation by percussion or palpation, normal fremitus, symmetrical and full respiratory excursions,  Healthy loop recorder  site Cardiovascular: normal position and quality of the apical impulse, regular rhythm, normal first and second heart sounds, no murmurs, rubs or gallops Abdomen: no tenderness or distention, no masses by palpation, no abnormal pulsatility or arterial bruits, normal bowel sounds, no hepatosplenomegaly Extremities: no clubbing, cyanosis or edema; 2+ radial, ulnar and brachial pulses bilaterally; 2+ right femoral, posterior tibial and dorsalis pedis pulses; 2+ left femoral, posterior tibial and dorsalis pedis pulses; no subclavian or femoral bruits Neurological: grossly nonfocal Psych: euthymic mood, full affect   EKG:  EKG is not ordered today.  Wt Readings from Last 3 Encounters:  06/30/15 260 lb (117.935 kg)  03/18/15 250 lb (113.399 kg)  03/12/15 257 lb (116.574 kg)  ASSESSMENT AND PLAN:  1. paroxysmal atrial fibrillation with recent embolic stroke -  Continue lifelong anticoagulation therapy. CHADSVasc score at least 2.  2. History of atrial flutter status post caval tricuspid isthmus ablation  3. Obesity and untreated obstructive sleep apnea with probable secondary cor pulmonale;  He reports being unable to afford the CPAP equipment co-pay/deductible  4. History of smoking and probable COPD also contributing to pulmonary vascular disease. He  Recently quit smoking,  But has been "cheating". Reminded him about the importance of avoiding cigarette smoking to prevent progression of obvious lung disease and reduce the risk of  future stroke    Current medicines are reviewed at length with the patient today.  The patient does not have concerns regarding medicines.  The following changes have been made:  no change  Labs/ tests ordered today include:  Orders Placed This Encounter  Procedures  . EKG 12-Lead    Patient Instructions  Dr. Royann Shivers recommends that you schedule a follow-up appointment in: 6 MONTHS       Signed, Shaylyn Bawa, MD  06/30/2015 11:32 PM     Thurmon Fair, MD, [facility] [phone] office [phone] pager

## 2015-07-07 LAB — CUP PACEART REMOTE DEVICE CHECK: Date Time Interrogation Session: 20161010183642

## 2015-07-16 ENCOUNTER — Ambulatory Visit (INDEPENDENT_AMBULATORY_CARE_PROVIDER_SITE_OTHER): Payer: Medicare HMO | Admitting: *Deleted

## 2015-07-16 DIAGNOSIS — I639 Cerebral infarction, unspecified: Secondary | ICD-10-CM

## 2015-07-17 NOTE — Progress Notes (Signed)
Carelink Summary Report / Loop recorder 

## 2015-08-07 ENCOUNTER — Encounter: Payer: Self-pay | Admitting: Cardiovascular Disease

## 2015-08-10 LAB — CUP PACEART REMOTE DEVICE CHECK: MDC IDC SESS DTM: 20161109191011

## 2015-08-10 NOTE — Progress Notes (Signed)
Carelink summary report received. Battery status OK. Normal device function. No new symptom, brady, or pause episodes. 2 tachy episodes--available EGM suggests SVT, duration 10sec, peak V 188bpm. 267 AF episodes (burden 16.2%), +Xarelto, avg V rate controlled. Monthly summary reports and ROV with [facility] in 12/2015.

## 2015-08-13 LAB — CUP PACEART INCLINIC DEVICE CHECK: Date Time Interrogation Session: 20161207112438

## 2015-08-15 ENCOUNTER — Ambulatory Visit (INDEPENDENT_AMBULATORY_CARE_PROVIDER_SITE_OTHER): Payer: Medicare HMO | Admitting: *Deleted

## 2015-08-15 DIAGNOSIS — I639 Cerebral infarction, unspecified: Secondary | ICD-10-CM | POA: Diagnosis not present

## 2015-08-18 NOTE — Progress Notes (Signed)
Carelink Summary Report / Loop Recorder 

## 2015-08-26 ENCOUNTER — Encounter: Payer: Self-pay | Admitting: Cardiovascular Disease

## 2015-09-15 ENCOUNTER — Ambulatory Visit (INDEPENDENT_AMBULATORY_CARE_PROVIDER_SITE_OTHER): Payer: Medicare HMO | Admitting: *Deleted

## 2015-09-15 DIAGNOSIS — I639 Cerebral infarction, unspecified: Secondary | ICD-10-CM

## 2015-09-15 NOTE — Progress Notes (Signed)
Carelink Summary Report / Loop Recorder 

## 2015-09-17 LAB — CUP PACEART REMOTE DEVICE CHECK: Date Time Interrogation Session: 20161209191058

## 2015-09-17 NOTE — Progress Notes (Signed)
Carelink summary report received. Battery status OK. Normal device function. 6.7% AF burden, V rates controlled, +Xarelto. No new symptom episodes, tachy episodes, brady, or pause episodes. Monthly summary reports and ROV/PRN

## 2015-10-14 ENCOUNTER — Ambulatory Visit (INDEPENDENT_AMBULATORY_CARE_PROVIDER_SITE_OTHER): Payer: Medicare HMO | Admitting: *Deleted

## 2015-10-14 DIAGNOSIS — I639 Cerebral infarction, unspecified: Secondary | ICD-10-CM

## 2015-10-15 NOTE — Progress Notes (Signed)
Carelink Summary Report / Loop Recorder 

## 2015-11-05 LAB — CUP PACEART REMOTE DEVICE CHECK: Date Time Interrogation Session: 20170108193745

## 2015-11-05 NOTE — Progress Notes (Signed)
Carelink summary report received. Battery status OK. Normal device function. No new symptom episodes, brady, or pause episodes. 12 tachy episodes- vent morphology appears similar to presenting- regular. 809 AF episodes, on Xarelto, current burden 26.5 %. No symptoms reported. Monthly summary reports and ROV/PRN

## 2015-11-13 ENCOUNTER — Ambulatory Visit (INDEPENDENT_AMBULATORY_CARE_PROVIDER_SITE_OTHER): Payer: Medicare HMO | Admitting: *Deleted

## 2015-11-13 DIAGNOSIS — I639 Cerebral infarction, unspecified: Secondary | ICD-10-CM

## 2015-11-14 NOTE — Progress Notes (Signed)
Carelink Summary Report / Loop Recorder 

## 2015-11-15 LAB — CUP PACEART REMOTE DEVICE CHECK: MDC IDC SESS DTM: 20170309200646

## 2015-11-15 NOTE — Progress Notes (Signed)
Carelink summary report received. Battery status OK. Normal device function. No new symptom episodes, tachy episodes, brady, or pause episodes. 54% AF, +Xarelto, V rates controlled.  Monthly summary reports and ROV/PRN 

## 2015-12-15 ENCOUNTER — Ambulatory Visit (INDEPENDENT_AMBULATORY_CARE_PROVIDER_SITE_OTHER): Payer: Medicare HMO | Admitting: *Deleted

## 2015-12-15 DIAGNOSIS — I639 Cerebral infarction, unspecified: Secondary | ICD-10-CM

## 2015-12-15 NOTE — Progress Notes (Signed)
Carelink Summary Report / Loop Recorder 

## 2015-12-27 LAB — CUP PACEART REMOTE DEVICE CHECK: Date Time Interrogation Session: 20170207200719

## 2015-12-27 NOTE — Progress Notes (Signed)
Carelink summary report received. Battery status OK. Normal device function. No new symptom episodes,  brady, or pause episodes. 2 tachy episodes- longest 32 seconds. EGMs appear SVT/PAT. 1215 AF- some appear true AF, others appear SR w/ PACs +Xarelto. Monthly summary reports and ROV/PRN

## 2015-12-30 ENCOUNTER — Encounter: Payer: Self-pay | Admitting: Cardiovascular Disease

## 2015-12-30 ENCOUNTER — Ambulatory Visit (INDEPENDENT_AMBULATORY_CARE_PROVIDER_SITE_OTHER): Payer: Medicare HMO | Admitting: Cardiovascular Disease

## 2015-12-30 VITALS — BP 134/82 | HR 103 | Ht 72.0 in | Wt 262.0 lb

## 2015-12-30 DIAGNOSIS — E669 Obesity, unspecified: Secondary | ICD-10-CM

## 2015-12-30 DIAGNOSIS — E7800 Pure hypercholesterolemia, unspecified: Secondary | ICD-10-CM | POA: Diagnosis not present

## 2015-12-30 DIAGNOSIS — I480 Paroxysmal atrial fibrillation: Secondary | ICD-10-CM

## 2015-12-30 DIAGNOSIS — G4733 Obstructive sleep apnea (adult) (pediatric): Secondary | ICD-10-CM

## 2015-12-30 DIAGNOSIS — I10 Essential (primary) hypertension: Secondary | ICD-10-CM

## 2015-12-30 DIAGNOSIS — Z720 Tobacco use: Secondary | ICD-10-CM

## 2015-12-30 DIAGNOSIS — J449 Chronic obstructive pulmonary disease, unspecified: Secondary | ICD-10-CM

## 2015-12-30 LAB — LIPID PANEL
CHOLESTEROL: 141 mg/dL (ref 125–200)
HDL: 51 mg/dL (ref >=40)
LDL Cholesterol: 71 mg/dL (ref <130)
TRIGLYCERIDES: 96 mg/dL (ref <150)
Total CHOL/HDL Ratio: 2.8 Ratio (ref <=5.0)
VLDL: 19 mg/dL (ref <30)

## 2015-12-30 MED ORDER — DILTIAZEM HCL ER 180 MG PO CP24: 180.0000 mg | ORAL_CAPSULE | Freq: Every day | ORAL | Status: DC

## 2015-12-30 NOTE — Addendum Note (Signed)
Addended by: Neta EhlersRUITT, ANGELA M on: 12/30/2015 08:54 AM   Modules accepted: Orders

## 2015-12-30 NOTE — Patient Instructions (Addendum)
Dr Royann Shiversroitoru has recommended making the following medication changes: 1. STOP Amlodipine 2. START Diltiazem 180 mg - take 1 capsule by mouth daily  Your physician recommends that you return for lab work at your earliest convenience - FASTING.  Dr Royann Shiversroitoru recommends that you schedule a follow-up appointment in 6 months. You will receive a reminder letter in the mail two months in advance. If you don't receive a letter, please call our office to schedule the follow-up appointment.  If you need a refill on your cardiac medications before your next appointment, please call your pharmacy.   Medication samples have been provided to the patient. Drug name: Xarelto 20 mg Qty: 35 tabs LOT: [id number] Exp.Date: 8/19  Ian Bushmanruitt, [place] 8:39 AM 12/30/2015

## 2015-12-30 NOTE — Progress Notes (Signed)
Patient ID: Miguel [name], male   DOB: 07/22/1951, 65 y.o.   MRN: [id number]    Cardiology Office Note    Date:  12/30/2015   ID:  Miguel [name], DOB 03/25/1951, MRN [id number]  PCP:  Michiel SitesWILLIAMS,DAVID G, MD  Cardiologist:   Thurmon FairROITORU,Harika Laidlaw, MD   No chief complaint on file.   History of Present Illness:  Miguel [name] is a 65 y.o. male with a history of stroke related to asymptomatic paroxysmal atrial fibrillation, previous history of atrial flutter status post ablation, COPD, ongoing tobacco use, obstructive sleep apnea not using CPAP, hyperlipidemia returning for follow-up. Interrogation of his loop recorder shows that the burden of atrial fibrillation has increased substantially. Over the last month he has been in persistent atrial fibrillation. Ventricular rate control is mediocre with average ventricular rates around the 105 bpm. Activity level remains unchanged. He notes that his dyspnea on exertion is slightly worse, making him stop when he walks from the home to the mailbox. He still appears to have NYHA functional class II exertional dyspnea. He denies problems with bleeding, edema, angina, new focal neurological events, palpitations, syncope, claudication or other vascular symptoms. He seems to be better balanced emotionally, with fewer stressors at home. He has reached the "doughnut hole" and has difficulty affording Advair and Xarelto.  Past Medical History  Diagnosis Date  . Seasonal allergies   . Cat allergies     and dogs  . COPD (chronic obstructive pulmonary disease) (HCC)   . Hyperlipidemia   . Depression   . GERD (gastroesophageal reflux disease)   . Avascular necrosis of bones of both hips (HCC)     s/p bilateral hip replacement  . Arthritis   . Paroxysmal atrial flutter (HCC)   . Hypertension   . OSA (obstructive sleep apnea)     Past Surgical History  Procedure Laterality Date  . Total hip arthroplasty      right and left  . Appendectomy    . Sinus  surgery with instatrak      x3  . Hernia repair      x3  . Right hand surgery    . Tonsillectomy    . Wrist arthroscopy with debridement  09/27/2012    Procedure: WRIST ARTHROSCOPY WITH DEBRIDEMENT;  Surgeon: Sharma CovertFred W Ortmann, MD;  Location: [facility];  Service: Orthopedics;  Laterality: Left;  Left Wrist Arthroscopy and Debridement of Triangular Cartilege Complex Tear and Lunotriquetral ligament,  Exploration of Sixth Dorsal Compartment and Repair  . Atrial flutter ablation  11/14/12    CTI ablation by Dr Johney FrameAllred  . Atrial flutter ablation N/A 11/14/2012    Procedure: ATRIAL FLUTTER ABLATION;  Surgeon: Hillis RangeJames Allred, MD;  [facility];  Service: Cardiovascular;  Laterality: N/A;  . Ep implantable device N/A 03/18/2015    Procedure: Loop Recorder Insertion;  Surgeon: Thurmon FairMihai Deasha Clendenin, MD;  Location: [facility]AS[facility];  Service: Cardiovascular;  Laterality: N/A;    Current Medications: Outpatient Prescriptions Prior to Visit  Medication Sig Dispense Refill  . albuterol (PROAIR HFA) 108 (90 BASE) MCG/ACT inhaler Inhale 2 puffs into the lungs every 6 (six) hours as needed. For wheezing    . amLODipine (NORVASC) 2.5 MG tablet Take 2.5 mg by mouth daily.    Marland Kitchen. atorvastatin (LIPITOR) 80 MG tablet Take 80 mg by mouth daily.    . cetirizine (ZYRTEC) 10 MG tablet Take 10 mg by mouth daily as needed. For allergy symptoms    . clonazePAM (  KLONOPIN) 0.5 MG tablet Take 5 mg by mouth as needed.    . DULoxetine (CYMBALTA) 30 MG capsule Take 30 mg by mouth at bedtime.    . DULoxetine (CYMBALTA) 60 MG capsule Take 60 mg by mouth every morning.     . Fluticasone-Salmeterol (ADVAIR) 250-50 MCG/DOSE AEPB Inhale 1 puff into the lungs 2 (two) times daily.    Marland Kitchen HYDROcodone-acetaminophen (NORCO) 10-325 MG per tablet Take by mouth 2 (two) times daily as needed.    Marland Kitchen lisinopril (PRINIVIL,ZESTRIL) 10 MG tablet Take 10 mg by mouth daily.    Marland Kitchen omeprazole (PRILOSEC) 40 MG capsule Take 40 mg by mouth  daily.    . rivaroxaban (XARELTO) 20 MG TABS tablet Take 1 tablet (20 mg total) by mouth daily with supper. 28 tablet 0  . zolpidem (AMBIEN) 10 MG tablet Take 10 mg by mouth at bedtime as needed for sleep.     No facility-administered medications prior to visit.     Allergies:   Aspirin and Tylenol   Social History   Social History  . Marital Status: Married    Spouse Name: N/A  . Number of Children: 2  . 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 Smoking status: Current Every Day Smoker -- 0.50 packs/day    Types: Cigarettes  . Smokeless tobacco: Never Used     Comment: Started smoking at age 32.  Currently smoking 1/2 ppd.  . Alcohol Use: Yes     Comment: beer (1-2 cans per day), remote heavy ETOH  . Drug Use: No  . Sexual Activity: Not on file     Comment: trying to cut down smoking   Other Topics Concern  . 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     Family History:  The patient's family history includes Arthritis in his mother; Asthma in his mother; Breast cancer in his sister; Cancer in his sister; Diabetes in his father and sister; Emphysema in his mother.   ROS:   Please see the history of present illness.    ROS All other systems reviewed and are negative.   PHYSICAL EXAM:   VS:  BP 134/82 mmHg  Pulse 103  Ht 6' (1.829 m)  Wt 118.842 kg (262 lb)  BMI 35.53 kg/m2   GEN: Well nourished, well developed, in no acute distress HEENT: normal Neck: no JVD, carotid bruits, or masses Cardiac: Irregular rhythm; no murmurs, rubs, or gallops,no edema  Respiratory: Emphysematous chest, a few scattered rhonchi, no wheezes, otherwise clear to auscultation bilaterally, normal work of breathing GI: soft, nontender, nondistended, + BS MS: no deformity or atrophy Skin: warm and dry, no rash Neuro:  Alert and Oriented x 3, Strength and  sensation are intact Psych: euthymic mood, full affect  Wt Readings from Last 3 Encounters:  12/30/15 118.842 kg (262 lb)  06/30/15 117.935 kg (260 lb)  03/18/15 113.399 kg (250 lb)      Studies/Labs Reviewed:   EKG:  EKG is ordered today.  The ekg ordered today demonstrates Atrial fibrillation with slow ventricular response, generalized low voltage consistent with emphysema, no significant repolarization abnormalities    ASSESSMENT:    1. Paroxysmal atrial fibrillation (HCC)   2. OSA (obstructive sleep apnea)   3. Obesity (BMI 30.0-34.9)   4. High cholesterol   5.  Chronic obstructive pulmonary disease, unspecified COPD type (HCC)   6. Tobacco abuse   7. Essential hypertension      PLAN:  In order of problems listed above:  1. AFib: Seems to be moving to a pattern of persistent atrial fibrillation. He is unaware of the arrhythmia and this has never appeared to be symptomatic. I don't think antiarrhythmic drugs will be either successful or beneficial. Continue uninterrupted anticoagulation. CHADSVasc at least 3 (CVA 2, HTN). A bleeding complications on anticoagulation. We'll try to help him with samples and application for patient assistance program for his Xarelto. He needs better rate control and will replace amlodipine with diltiazem. 2. OSA: Is not started treatment due to cost issues and concerns about claustrophobia. Untreated sleep apnea will decrease the likelihood of successful treatment of atrial fibrillation 3. Weight loss will help both with the obstructive sleep apnea and the overall burden of A. Fib 4. HLP: Recheck lipid profile 5. COPD: Seems to be well compensated now, but a severe chronic condition 6. Tobacco abuse: smoking cessation discussed in detail    Medication Adjustments/Labs and Tests Ordered: Current medicines are reviewed at length with the patient today.  Concerns regarding medicines are outlined above.  Medication changes, Labs and Tests ordered  today are listed in the Patient Instructions below. There are no Patient Instructions on file for this visit.   Joie Bimler, MD  12/30/2015 8:18 AM    [facility] [address] [place], [place], [place]  [address] Phone: [phone]; Fax: [phone]

## 2015-12-31 ENCOUNTER — Telehealth: Payer: Self-pay

## 2015-12-31 ENCOUNTER — Telehealth: Payer: Self-pay | Admitting: Cardiovascular Disease

## 2015-12-31 NOTE — Telephone Encounter (Signed)
Patient saw Dr Salena Saner Tuesday, 4/25, and requested patient assistance with Xarelto.  Reached out to Prudence DavidsonKelley Auten, PharmD. Xarelto actually doesn't have a patient assistance program that will be beneficial to the patient.   The pharmacy staff recommended switching patient to Eliquis. They're patient assistance program may cover the cost of his medications if he can prove he hast spent at least 3% of his annual income on medication.  I relayed this information with patient and patient's wife, Santina EvansCatherine. Patient states he is fine with taking Xarelto - he hasn't had any issues other than cost.   I presented them with their options - continuing with the Xarelto or switching to Eliquis. Patient and wife were hesitant to switching his medications.  Wife asked if the patient could just take a baby aspirin - will defer question to Dr C.

## 2015-12-31 NOTE — Telephone Encounter (Signed)
Spoke to patient's wife. lipids Result given . Verbalized understanding

## 2015-12-31 NOTE — Telephone Encounter (Signed)
New message    Pt wife is calling stating she is returning [facility] for [facility]

## 2016-01-01 NOTE — Telephone Encounter (Signed)
Aspirin is not a useful alternative. Eliquis is just as good as the Xarelto.

## 2016-01-02 NOTE — Telephone Encounter (Signed)
lmtcb

## 2016-01-06 NOTE — Telephone Encounter (Signed)
lmtcb

## 2016-01-12 ENCOUNTER — Ambulatory Visit (INDEPENDENT_AMBULATORY_CARE_PROVIDER_SITE_OTHER): Payer: Medicare HMO | Admitting: *Deleted

## 2016-01-12 DIAGNOSIS — I639 Cerebral infarction, unspecified: Secondary | ICD-10-CM | POA: Diagnosis not present

## 2016-01-13 NOTE — Progress Notes (Signed)
Carelink Summary Report / Loop Recorder 

## 2016-01-19 NOTE — Telephone Encounter (Signed)
Message sent to patient via mychart requesting a call back.

## 2016-02-01 LAB — CUP PACEART REMOTE DEVICE CHECK: MDC IDC SESS DTM: 20170408204131

## 2016-02-01 NOTE — Progress Notes (Signed)
Carelink summary report received. Battery status OK. Normal device function. No new symptom episodes, tachy episodes, brady, or pause episodes. 82% AF, v rates controlled, +Xarelto. Monthly summary reports and ROV/PRN

## 2016-02-11 ENCOUNTER — Ambulatory Visit (INDEPENDENT_AMBULATORY_CARE_PROVIDER_SITE_OTHER): Payer: Medicare HMO | Admitting: *Deleted

## 2016-02-11 DIAGNOSIS — I639 Cerebral infarction, unspecified: Secondary | ICD-10-CM

## 2016-02-12 NOTE — Progress Notes (Signed)
Carelink Summary Report / Loop Recorder 

## 2016-02-18 LAB — CUP PACEART REMOTE DEVICE CHECK: MDC IDC SESS DTM: 20170508210948

## 2016-03-12 ENCOUNTER — Ambulatory Visit (INDEPENDENT_AMBULATORY_CARE_PROVIDER_SITE_OTHER): Payer: Medicare HMO | Admitting: *Deleted

## 2016-03-12 DIAGNOSIS — I639 Cerebral infarction, unspecified: Secondary | ICD-10-CM | POA: Diagnosis not present

## 2016-03-15 NOTE — Progress Notes (Signed)
Carelink Summary Report / Loop Recorder 

## 2016-03-18 LAB — CUP PACEART REMOTE DEVICE CHECK: Date Time Interrogation Session: 20170607213612

## 2016-04-07 LAB — CUP PACEART REMOTE DEVICE CHECK: Date Time Interrogation Session: 20170707224041

## 2016-04-07 NOTE — Progress Notes (Signed)
Carelink summary report received. Battery status OK. Normal device function. No new symptom episodes, tachy episodes, brady, or pause episodes. 11 AF- 99.9% +Diltiazem +Xarelto, Vent. rates appear controlled. Monthly summary reports and ROV/PRN

## 2016-04-12 ENCOUNTER — Ambulatory Visit (INDEPENDENT_AMBULATORY_CARE_PROVIDER_SITE_OTHER): Payer: Medicare HMO | Admitting: *Deleted

## 2016-04-12 DIAGNOSIS — I639 Cerebral infarction, unspecified: Secondary | ICD-10-CM

## 2016-04-12 NOTE — Progress Notes (Signed)
Carelink Summary Report / Loop Recorder 

## 2016-04-19 LAB — CUP PACEART REMOTE DEVICE CHECK: MDC IDC SESS DTM: 20170806223846

## 2016-05-11 ENCOUNTER — Ambulatory Visit (INDEPENDENT_AMBULATORY_CARE_PROVIDER_SITE_OTHER): Payer: Medicare HMO | Admitting: *Deleted

## 2016-05-11 DIAGNOSIS — I639 Cerebral infarction, unspecified: Secondary | ICD-10-CM | POA: Diagnosis not present

## 2016-05-12 NOTE — Progress Notes (Signed)
Carelink Summary Report / Loop Recorder 

## 2016-06-05 LAB — CUP PACEART REMOTE DEVICE CHECK: MDC IDC SESS DTM: 20170905223724

## 2016-06-05 NOTE — Progress Notes (Signed)
Carelink summary report received. Battery status OK. Normal device function. No new symptom episodes, tachy episodes, brady, or pause episodes. 5 AF episodes. AF burden 100% +Xarelto. Monthly summary reports and ROV/PRN

## 2016-06-10 ENCOUNTER — Ambulatory Visit (INDEPENDENT_AMBULATORY_CARE_PROVIDER_SITE_OTHER): Payer: Medicare HMO | Admitting: *Deleted

## 2016-06-10 DIAGNOSIS — I639 Cerebral infarction, unspecified: Secondary | ICD-10-CM

## 2016-06-11 NOTE — Progress Notes (Signed)
Carelink Summary Report / Loop Recorder 

## 2016-07-12 ENCOUNTER — Ambulatory Visit (INDEPENDENT_AMBULATORY_CARE_PROVIDER_SITE_OTHER): Payer: Medicare HMO | Admitting: *Deleted

## 2016-07-12 DIAGNOSIS — I639 Cerebral infarction, unspecified: Secondary | ICD-10-CM

## 2016-07-12 NOTE — Progress Notes (Signed)
Carelink Summary Report / Loop Recorder 

## 2016-07-18 LAB — CUP PACEART REMOTE DEVICE CHECK
Date Time Interrogation Session: 20171005233937
MDC IDC PG IMPLANT DT: 20160712

## 2016-08-09 ENCOUNTER — Ambulatory Visit (INDEPENDENT_AMBULATORY_CARE_PROVIDER_SITE_OTHER): Payer: Medicare HMO | Admitting: *Deleted

## 2016-08-09 DIAGNOSIS — I639 Cerebral infarction, unspecified: Secondary | ICD-10-CM

## 2016-08-10 NOTE — Progress Notes (Signed)
Carelink Summary Report / Loop Recorder 

## 2016-08-27 LAB — CUP PACEART REMOTE DEVICE CHECK
Date Time Interrogation Session: 20171105000656
Implantable Pulse Generator Implant Date: 20160712

## 2016-08-27 NOTE — Progress Notes (Signed)
Carelink summary report received. Battery status OK. Normal device function. No new symptom episodes, tachy episodes, brady, or pause episodes. AF 99.9% +Xarelto. Fair histograms. Monthly summary reports and ROV/PRN

## 2016-09-08 ENCOUNTER — Ambulatory Visit (INDEPENDENT_AMBULATORY_CARE_PROVIDER_SITE_OTHER): Payer: Medicare HMO | Admitting: *Deleted

## 2016-09-08 DIAGNOSIS — I639 Cerebral infarction, unspecified: Secondary | ICD-10-CM

## 2016-09-09 NOTE — Progress Notes (Signed)
Carelink Summary Report / Loop Recorder 

## 2016-09-16 LAB — CUP PACEART REMOTE DEVICE CHECK
Date Time Interrogation Session: 20171205010745
Implantable Pulse Generator Implant Date: 20160712

## 2016-09-29 ENCOUNTER — Encounter: Payer: Self-pay | Admitting: Cardiovascular Disease

## 2016-09-29 ENCOUNTER — Ambulatory Visit (INDEPENDENT_AMBULATORY_CARE_PROVIDER_SITE_OTHER): Payer: Medicare HMO | Admitting: Cardiovascular Disease

## 2016-09-29 VITALS — BP 131/85 | HR 77 | Ht 72.0 in | Wt 253.8 lb

## 2016-09-29 DIAGNOSIS — E669 Obesity, unspecified: Secondary | ICD-10-CM

## 2016-09-29 DIAGNOSIS — Z79899 Other long term (current) drug therapy: Secondary | ICD-10-CM

## 2016-09-29 DIAGNOSIS — Z720 Tobacco use: Secondary | ICD-10-CM

## 2016-09-29 DIAGNOSIS — E785 Hyperlipidemia, unspecified: Secondary | ICD-10-CM

## 2016-09-29 DIAGNOSIS — J449 Chronic obstructive pulmonary disease, unspecified: Secondary | ICD-10-CM

## 2016-09-29 DIAGNOSIS — Z7901 Long term (current) use of anticoagulants: Secondary | ICD-10-CM | POA: Diagnosis not present

## 2016-09-29 DIAGNOSIS — I480 Paroxysmal atrial fibrillation: Secondary | ICD-10-CM

## 2016-09-29 DIAGNOSIS — G4733 Obstructive sleep apnea (adult) (pediatric): Secondary | ICD-10-CM

## 2016-09-29 LAB — CUP PACEART INCLINIC DEVICE CHECK
MDC IDC PG IMPLANT DT: 20160712
MDC IDC SESS DTM: 20180124083534

## 2016-09-29 MED ORDER — RIVAROXABAN 20 MG PO TABS: 20.0000 mg | ORAL_TABLET | Freq: Every day | ORAL | 3 refills | Status: DC

## 2016-09-29 NOTE — Patient Instructions (Signed)
Dr Croitoru recommends that you continue on your current medications as directed. Please refer to the Current Medication list given to you today.  Your physician recommends that you return for lab work at your earliest convenience - FASTING.  Dr Croitoru recommends that you schedule a follow-up appointment in 1 year. You will receive a reminder letter in the mail two months in advance. If you don't receive a letter, please call our office to schedule the follow-up appointment.  If you need a refill on your cardiac medications before your next appointment, please call your pharmacy. 

## 2016-09-29 NOTE — Progress Notes (Signed)
Patient ID: Miguel [name], male   DOB: [birth year], 66 y.o.   MRN: [id number]    Cardiology Office Note    Date:  09/29/2016   ID:  Miguel [name], DOB 1951-03-19, MRN [id number]  PCP:  Michiel Sites, MD (Inactive)  Cardiologist:   Thurmon Fair, MD   Chief Complaint  Patient presents with  . Follow-up    pt reports no complaints    History of Present Illness:  Miguel [name] is a 66 y.o. male with a history of stroke related to asymptomatic paroxysmal atrial fibrillation, previous history of atrial flutter status post ablation, COPD, ongoing tobacco use, obstructive sleep apnea not using CPAP, hyperlipidemia returning for follow-up. Interrogation of his loop recorder shows that the atrial fibrillation has become a permanent arrhythmia. Ventricular rate control is fair with average ventricular rates around the 80-90 bpm. Activity level remains unchanged. His dyspnea on exertion is unchanged, NYHA functional class II. He denies problems with bleeding, edema, angina, new focal neurological events, palpitations, syncope, claudication or other vascular symptoms. He seems to be balanced emotionally,   He has not had any bleeding complications or any new neurological events. He did have a fall when pumping gas, although he did not lose consciousness. He is not sure why but he fell backwards and slammed his head against the concrete. He did not seek medical attention. This happened about 3 months ago.  Past Medical History:  Diagnosis Date  . Arthritis   . Avascular necrosis of bones of both hips (HCC)    s/p bilateral hip replacement  . Cat allergies    and dogs  . COPD (chronic obstructive pulmonary disease) (HCC)   . Depression   . GERD (gastroesophageal reflux disease)   . Hyperlipidemia   . Hypertension   . OSA (obstructive sleep apnea)   . Paroxysmal atrial flutter (HCC)   . Seasonal allergies     Past Surgical History:  Procedure Laterality Date  . APPENDECTOMY    .  ATRIAL FLUTTER ABLATION  11/14/12   CTI ablation by Dr Johney Frame  . ATRIAL FLUTTER ABLATION N/A 11/14/2012   Procedure: ATRIAL FLUTTER ABLATION;  Surgeon: Hillis Range, MD;  Location: [facility];  Service: Cardiovascular;  Laterality: N/A;  . EP IMPLANTABLE DEVICE N/A 03/18/2015   Procedure: Loop Recorder Insertion;  Surgeon: Thurmon Fair, MD;  Location: [facility];  Service: Cardiovascular;  Laterality: N/A;  . HERNIA REPAIR     x3  . right hand surgery    . SINUS SURGERY WITH INSTATRAK     x3  . TONSILLECTOMY    . TOTAL HIP ARTHROPLASTY     right and left  . WRIST ARTHROSCOPY WITH DEBRIDEMENT  09/27/2012   Procedure: WRIST ARTHROSCOPY WITH DEBRIDEMENT;  Surgeon: Sharma Covert, MD;  Location: [facility];  Service: Orthopedics;  Laterality: Left;  Left Wrist Arthroscopy and Debridement of Triangular Cartilege Complex Tear and Lunotriquetral ligament,  Exploration of Sixth Dorsal Compartment and Repair    Current Medications: Outpatient Medications Prior to Visit  Medication Sig Dispense Refill  . albuterol (PROAIR HFA) 108 (90 BASE) MCG/ACT inhaler Inhale 2 puffs into the lungs every 6 (six) hours as needed. For wheezing    . atorvastatin (LIPITOR) 80 MG tablet Take 80 mg by mouth daily.    . cetirizine (ZYRTEC) 10 MG tablet Take 10 mg by mouth daily as needed. For allergy symptoms    . Cholecalciferol ([employer] VIT D 5000 HIGH-POTENCY PO) Take  1 tablet by mouth daily.    . clonazePAM (KLONOPIN) 0.5 MG tablet Take 5 mg by mouth as needed.    . Cyanocobalamin (VITAMIN B 12 PO) Take 1 tablet by mouth daily.    Marland Kitchen. diltiazem (DILACOR XR) 180 MG 24 hr capsule Take 1 capsule (180 mg total) by mouth daily. 90 capsule 3  . DULoxetine (CYMBALTA) 30 MG capsule Take 30 mg by mouth at bedtime.    . DULoxetine (CYMBALTA) 60 MG capsule Take 60 mg by mouth every morning.     . Fluticasone-Salmeterol (ADVAIR) 250-50 MCG/DOSE AEPB Inhale 1 puff into the lungs 2 (two) times daily.    Marland Kitchen.  HYDROcodone-acetaminophen (NORCO) 10-325 MG per tablet Take by mouth 2 (two) times daily as needed.    Marland Kitchen. lisinopril (PRINIVIL,ZESTRIL) 10 MG tablet Take 10 mg by mouth daily.    Marland Kitchen. omeprazole (PRILOSEC) 40 MG capsule Take 40 mg by mouth daily.    . rivaroxaban (XARELTO) 20 MG TABS tablet Take 1 tablet (20 mg total) by mouth daily with supper. 28 tablet 0  . zolpidem (AMBIEN) 10 MG tablet Take 10 mg by mouth at bedtime as needed for sleep.     No facility-administered medications prior to visit.      Allergies:   Aspirin and Tylenol [acetaminophen]   Social History   Social History  . Marital status: Married    Spouse name: N/A  . Number of children: 2  . Years of education: N/A   Occupational History  . disability     painting   Social History Main Topics  . Smoking status: Current Every Day Smoker    Packs/day: 0.50    Types: Cigarettes  . Smokeless tobacco: Never Used     Comment: Started smoking at age 66.  Currently smoking 1/2 ppd.  . Alcohol use Yes     Comment: beer (1-2 cans per day), remote heavy ETOH  . Drug use: No  . Sexual activity: Not Asked     Comment: trying to cut down smoking   Other Topics Concern  . None   Social History Narrative   [place].  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     Family History:  The patient's family history includes Arthritis in his mother; Asthma in his mother; Breast cancer in his sister; Cancer in his sister; Diabetes in his father and sister; Emphysema in his mother.   ROS:   Please see the history of present illness.    ROS All other systems reviewed and are negative.   PHYSICAL EXAM:   VS:  BP 131/85   Pulse 77   Ht 6' (1.829 m)   Wt 115.1 kg (253 lb 12.8 oz)   BMI 34.42 kg/m    GEN: Well nourished, well developed, in no acute distress  HEENT: normal  Neck: no JVD, carotid bruits, or masses Cardiac: Irregular rhythm; no murmurs, rubs, or gallops,no edema  Respiratory: Emphysematous chest, a  few scattered rhonchi, no wheezes, otherwise clear to auscultation bilaterally, normal work of breathing GI: soft, nontender, nondistended, + BS MS: no deformity or atrophy  Skin: warm and dry, no rash Neuro:  Alert and Oriented x 3, Strength and sensation are intact Psych: euthymic mood, full affect  Wt Readings from Last 3 Encounters:  09/29/16 115.1 kg (253 lb 12.8 oz)  12/30/15 118.8 kg (262 lb)  06/30/15 117.9 kg (260 lb)      Studies/Labs Reviewed:   EKG:  EKG is ordered today.  The ekg ordered today demonstrates Atrial fibrillation with slow ventricular response, generalized low voltage consistent with emphysema, no significant repolarization abnormalities    ASSESSMENT:    1. Paroxysmal atrial fibrillation (HCC)   2. Long term current use of anticoagulant   3. OSA (obstructive sleep apnea)   4. Obesity (BMI 30.0-34.9)   5. Dyslipidemia   6. Chronic obstructive pulmonary disease, unspecified COPD type (HCC)   7. Tobacco abuse   8. Medication management      PLAN:  In order of problems listed above:  1. AFib: Seems to have settled in a pattern of permanent atrial fibrillation. Rate control is adequate. He is unaware of the arrhythmia and this has never appeared to be symptomatic. Continue uninterrupted anticoagulation. CHADSVasc at least 3 (CVA 2, HTN).  2. Xarelto: no bleeding complications on anticoagulation. Asked him to seek urgent medical care if he has any head injuries in the future. 3. OSA: declines treatment due to cost issues and concerns about claustrophobia. Untreated sleep apnea might be partly responsible for the high burden of atrial fibrillation 4. Weight loss will help both with the obstructive sleep apnea and the overall burden of A. Fib. He has lost about 12 pounds since his last appointment and is congratulated 5. HLP: Recheck lipid profile. LDL was 71 last year. 6. COPD: Seems to be well compensated now, but a severe chronic condition 7. Tobacco  abuse: smoking cessation discussed in detail    Medication Adjustments/Labs and Tests Ordered: Current medicines are reviewed at length with the patient today.  Concerns regarding medicines are outlined above.  Medication changes, Labs and Tests ordered today are listed in the Patient Instructions below. Patient Instructions  Dr Royann Shivers recommends that you continue on your current medications as directed. Please refer to the Current Medication list given to you today.  Your physician recommends that you return for lab work at your earliest convenience - FASTING.  Dr Royann Shivers recommends that you schedule a follow-up appointment in 1 year. You will receive a reminder letter in the mail two months in advance. If you don't receive a letter, please call our office to schedule the follow-up appointment.  If you need a refill on your cardiac medications before your next appointment, please call your pharmacy.    Signed, Thurmon Fair, MD  09/29/2016 8:46 AM    [facility] [address] [place], [place], [place]  [address] Phone: [phone]; Fax: [phone]

## 2016-10-08 ENCOUNTER — Ambulatory Visit (INDEPENDENT_AMBULATORY_CARE_PROVIDER_SITE_OTHER): Payer: Medicare HMO | Admitting: *Deleted

## 2016-10-08 DIAGNOSIS — I639 Cerebral infarction, unspecified: Secondary | ICD-10-CM | POA: Diagnosis not present

## 2016-10-11 NOTE — Progress Notes (Signed)
Carelink Summary Report / Loop Recorder 

## 2016-10-23 LAB — CUP PACEART REMOTE DEVICE CHECK
Date Time Interrogation Session: 20180104014057
Implantable Pulse Generator Implant Date: 20160712

## 2016-11-04 LAB — CUP PACEART REMOTE DEVICE CHECK
Date Time Interrogation Session: 20180203014046
MDC IDC PG IMPLANT DT: 20160712

## 2016-11-04 NOTE — Progress Notes (Signed)
Carelink summary report received. Battery status OK. Normal device function. No new symptom episodes, tachy episodes, brady, or pause episodes. 8 AF appears 100% +Xarelto. Monthly summary reports and ROV/PRN

## 2016-11-08 ENCOUNTER — Ambulatory Visit (INDEPENDENT_AMBULATORY_CARE_PROVIDER_SITE_OTHER): Payer: Medicare HMO | Admitting: *Deleted

## 2016-11-08 DIAGNOSIS — I639 Cerebral infarction, unspecified: Secondary | ICD-10-CM | POA: Diagnosis not present

## 2016-11-08 NOTE — Progress Notes (Signed)
Carelink Summary Report / Loop Recorder 

## 2016-11-20 LAB — CUP PACEART REMOTE DEVICE CHECK
Date Time Interrogation Session: 20180305023651
MDC IDC PG IMPLANT DT: 20160712

## 2016-11-20 NOTE — Progress Notes (Signed)
Carelink summary report received. Battery status OK. Normal device function. No new symptom episodes, tachy episodes, brady, or pause episodes. 26 AF 99.8% +Xarelto Monthly summary reports and ROV/PRN

## 2016-12-07 ENCOUNTER — Ambulatory Visit (INDEPENDENT_AMBULATORY_CARE_PROVIDER_SITE_OTHER): Payer: Medicare HMO | Admitting: *Deleted

## 2016-12-07 DIAGNOSIS — I639 Cerebral infarction, unspecified: Secondary | ICD-10-CM | POA: Diagnosis not present

## 2016-12-08 NOTE — Progress Notes (Signed)
Carelink Summary Report / Loop Recorder 

## 2016-12-13 LAB — CUP PACEART REMOTE DEVICE CHECK
Date Time Interrogation Session: 20180404033810
MDC IDC PG IMPLANT DT: 20160712

## 2016-12-13 NOTE — Progress Notes (Signed)
Carelink summary report received. Battery status OK. Normal device function. No new symptom episodes, tachy episodes, brady, or pause episodes. 65 AF 99.5% No ECGs. +Xarelto. Good histograms.Monthly summary reports and ROV/PRN

## 2016-12-16 IMAGING — CR DG HIP (WITH OR WITHOUT PELVIS) 5+V BILAT
3 series · 3 of 3 positions shown · non-contrast
Comparison: None

CLINICAL DATA: Bilateral hip and posterior pelvic pain for several
weeks, multiple falls posteriorly striking the tail bone have
occurred; right hip pain is greater than the left; history of
avascular or necrosis of both hips with placement of prosthetic hips
joints in 0303 on the right in 5965 on the left.

EXAM:
BILATERAL HIP (WITH PELVIS) 5-6 VIEWS

[w pelvis (1 of 3)]
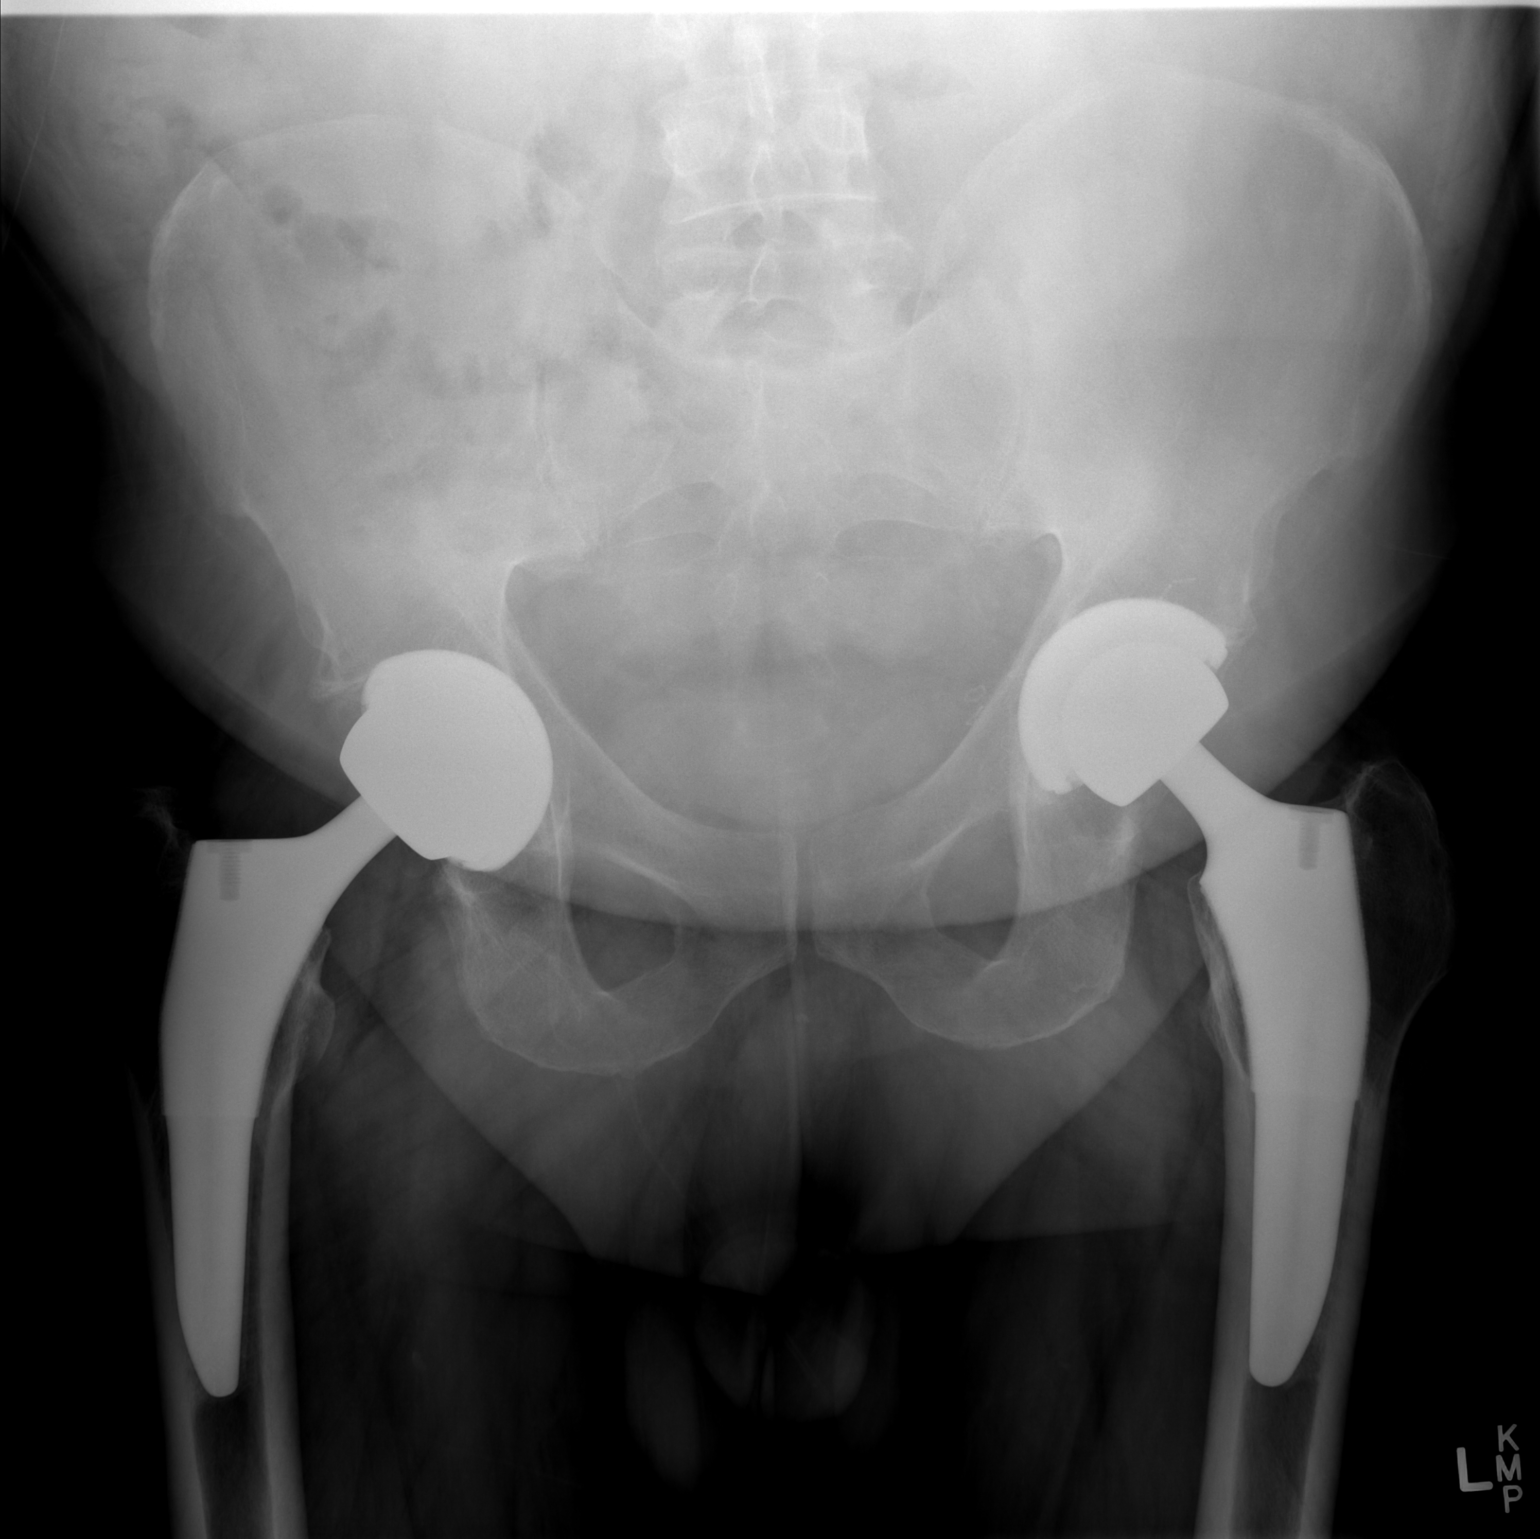

[w pelvis (2 of 3)]
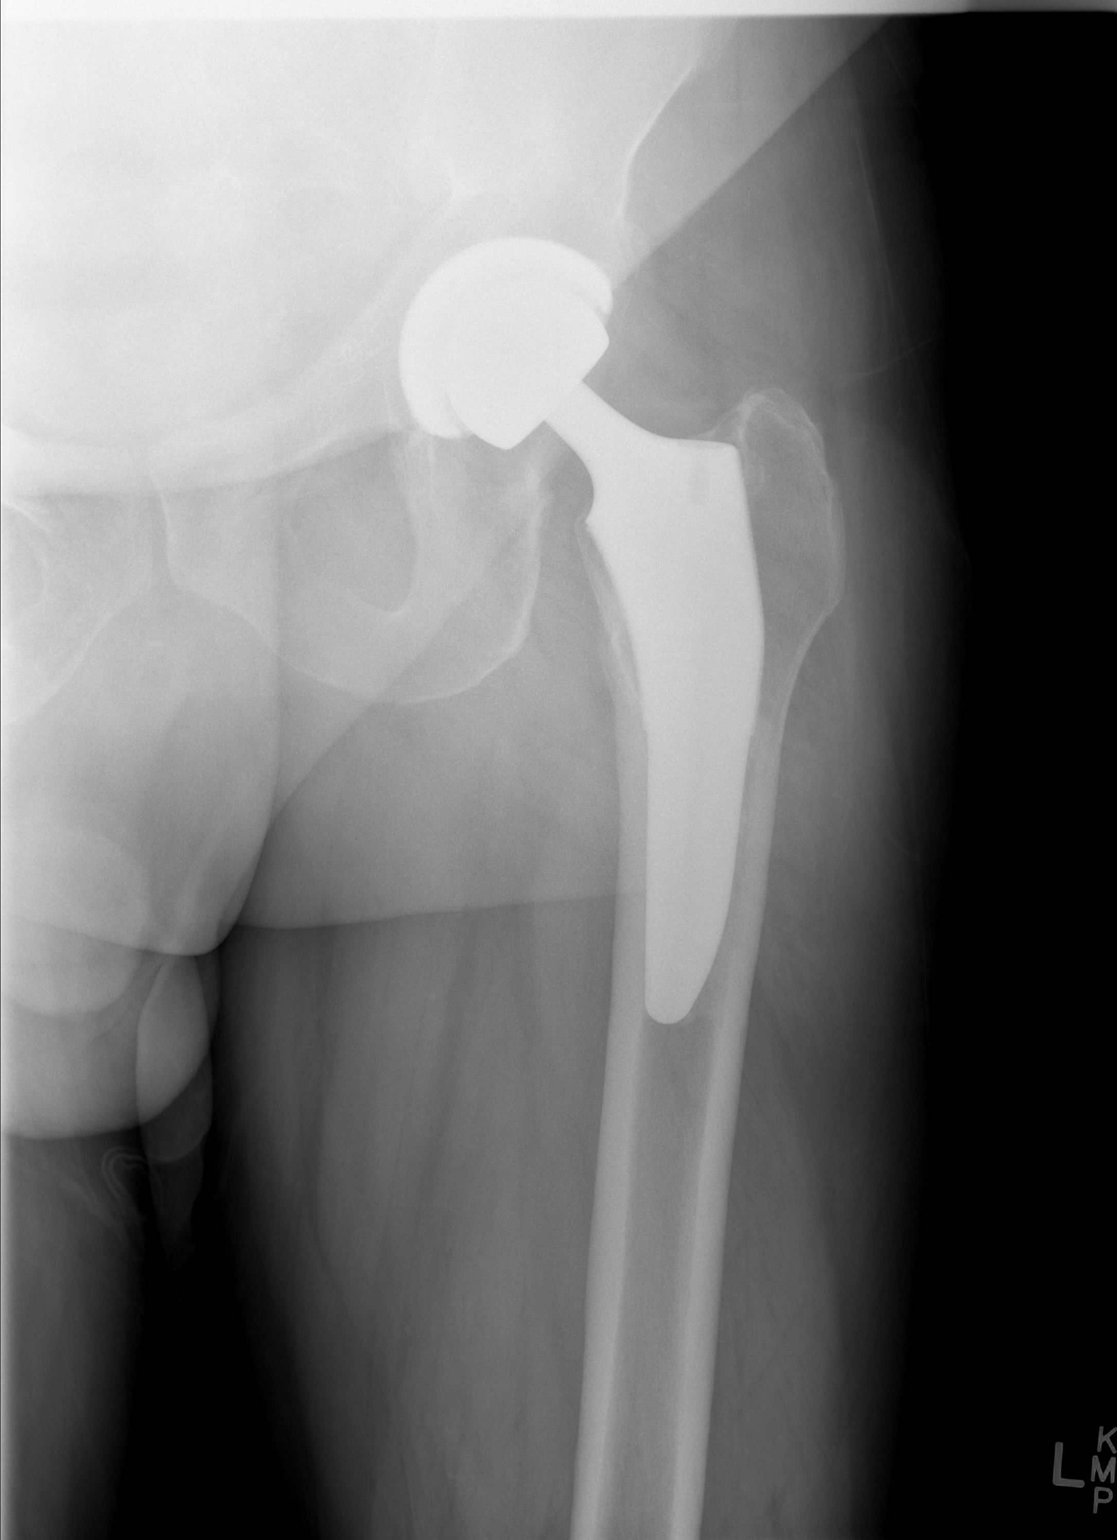

[w pelvis (3 of 3)]
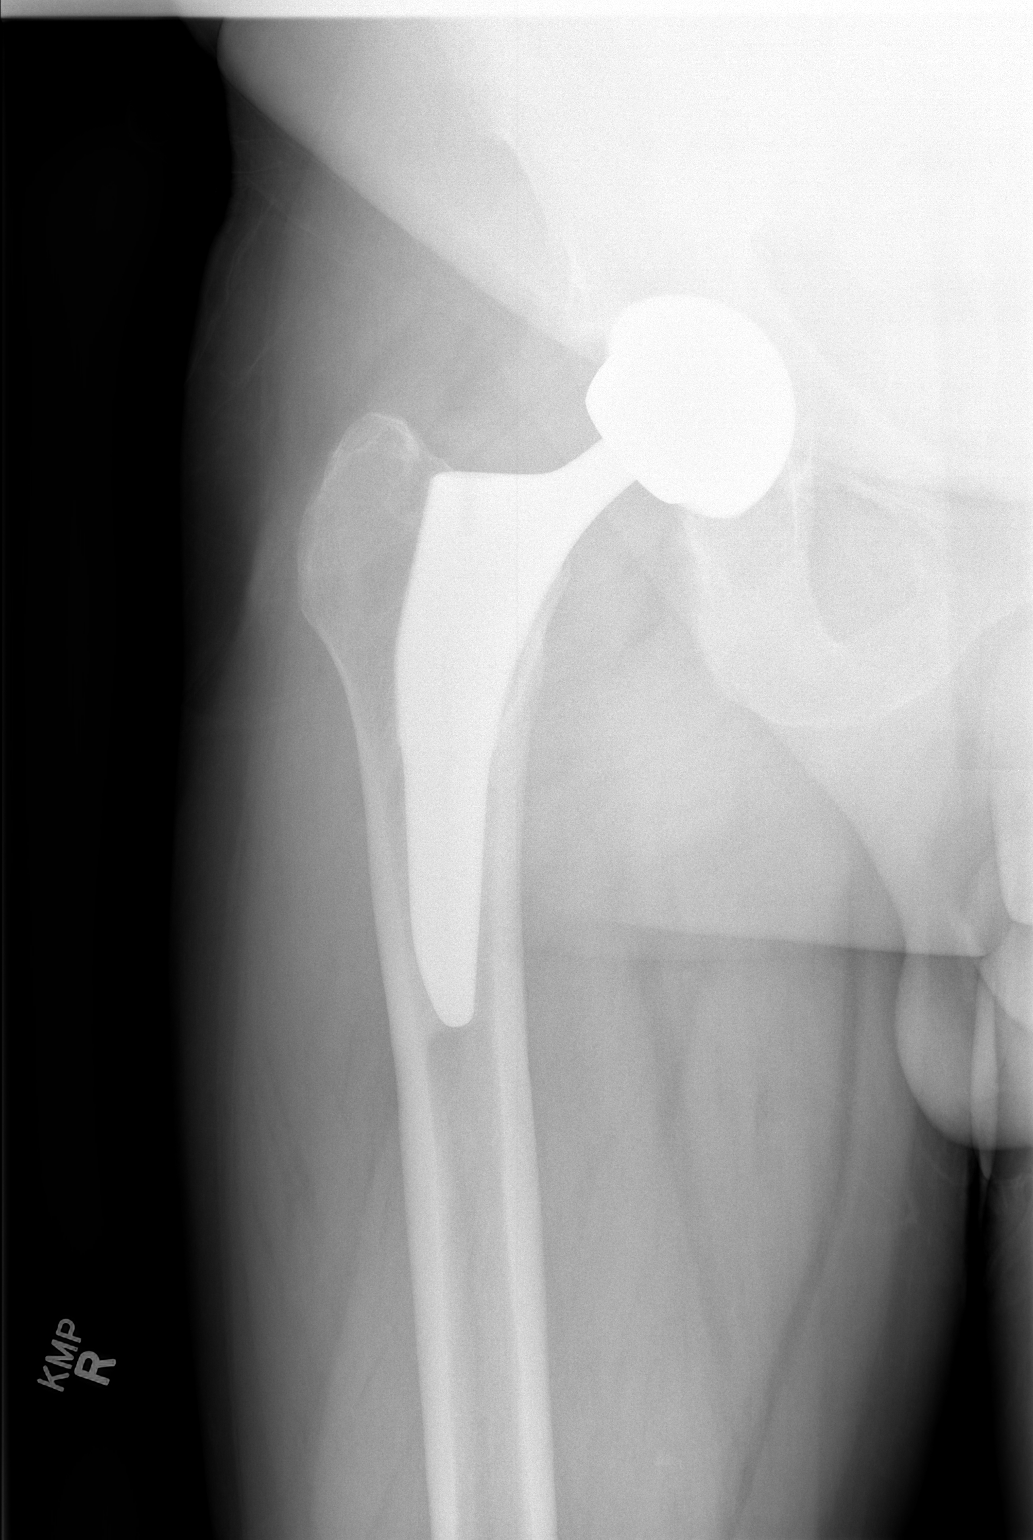

[3 of 3 positions shown; findings below may reference images not displayed]

FINDINGS: The bony pelvis is adequately mineralized. The sacrum and SI joints
are unremarkable. The prosthetic hip joints appear appropriately
positioned. There is no evidence of metallic hardware failure. The
interface with the native bone is normal. The soft tissues are
unremarkable.
IMPRESSION: There is no acute native bone abnormality of the pelvis or hips. The
prostheses are intact and appropriately positioned radiographically.

## 2017-01-06 ENCOUNTER — Ambulatory Visit (INDEPENDENT_AMBULATORY_CARE_PROVIDER_SITE_OTHER): Payer: Medicare HMO | Admitting: *Deleted

## 2017-01-06 DIAGNOSIS — I639 Cerebral infarction, unspecified: Secondary | ICD-10-CM

## 2017-01-10 NOTE — Progress Notes (Signed)
Carelink Summary Report / Loop Recorder 

## 2017-01-19 LAB — CUP PACEART REMOTE DEVICE CHECK
Date Time Interrogation Session: 20180504040742
MDC IDC PG IMPLANT DT: 20160712

## 2017-02-07 ENCOUNTER — Ambulatory Visit (INDEPENDENT_AMBULATORY_CARE_PROVIDER_SITE_OTHER): Payer: Medicare HMO | Admitting: *Deleted

## 2017-02-07 DIAGNOSIS — I639 Cerebral infarction, unspecified: Secondary | ICD-10-CM

## 2017-02-07 NOTE — Progress Notes (Signed)
Carelink Summary Report / Loop Recorder 

## 2017-02-09 LAB — CUP PACEART REMOTE DEVICE CHECK
Date Time Interrogation Session: 20180603044315
Implantable Pulse Generator Implant Date: 20160712

## 2017-03-08 ENCOUNTER — Ambulatory Visit (INDEPENDENT_AMBULATORY_CARE_PROVIDER_SITE_OTHER): Payer: Medicare HMO | Admitting: *Deleted

## 2017-03-08 DIAGNOSIS — I639 Cerebral infarction, unspecified: Secondary | ICD-10-CM

## 2017-03-08 NOTE — Progress Notes (Signed)
Carelink Summary Report / Loop Recorder 

## 2017-03-17 LAB — CUP PACEART REMOTE DEVICE CHECK
Date Time Interrogation Session: 20180703081843
MDC IDC PG IMPLANT DT: 20160712

## 2017-04-07 ENCOUNTER — Ambulatory Visit (INDEPENDENT_AMBULATORY_CARE_PROVIDER_SITE_OTHER): Payer: Medicare HMO | Admitting: *Deleted

## 2017-04-07 DIAGNOSIS — I639 Cerebral infarction, unspecified: Secondary | ICD-10-CM | POA: Diagnosis not present

## 2017-04-07 NOTE — Progress Notes (Signed)
Carelink Summary Report / Loop Recorder 

## 2017-04-19 LAB — CUP PACEART REMOTE DEVICE CHECK
Date Time Interrogation Session: 20180802094222
MDC IDC PG IMPLANT DT: 20160712

## 2017-05-10 ENCOUNTER — Ambulatory Visit (INDEPENDENT_AMBULATORY_CARE_PROVIDER_SITE_OTHER): Payer: Medicare HMO | Admitting: *Deleted

## 2017-05-10 DIAGNOSIS — I639 Cerebral infarction, unspecified: Secondary | ICD-10-CM | POA: Diagnosis not present

## 2017-05-11 NOTE — Progress Notes (Signed)
Carelink Summary Report / Loop Recorder 

## 2017-05-12 LAB — CUP PACEART REMOTE DEVICE CHECK
Date Time Interrogation Session: 20180901093855
Implantable Pulse Generator Implant Date: 20160712

## 2017-06-06 ENCOUNTER — Ambulatory Visit (INDEPENDENT_AMBULATORY_CARE_PROVIDER_SITE_OTHER): Payer: Medicare HMO | Admitting: *Deleted

## 2017-06-06 DIAGNOSIS — I639 Cerebral infarction, unspecified: Secondary | ICD-10-CM | POA: Diagnosis not present

## 2017-06-06 NOTE — Progress Notes (Signed)
Carelink Summary Report / Loop Recorder 

## 2017-06-07 LAB — CUP PACEART REMOTE DEVICE CHECK
Implantable Pulse Generator Implant Date: 20160712
MDC IDC SESS DTM: 20181001121703

## 2017-07-06 ENCOUNTER — Ambulatory Visit (INDEPENDENT_AMBULATORY_CARE_PROVIDER_SITE_OTHER): Payer: Medicare HMO | Admitting: *Deleted

## 2017-07-06 DIAGNOSIS — I639 Cerebral infarction, unspecified: Secondary | ICD-10-CM | POA: Diagnosis not present

## 2017-07-07 NOTE — Progress Notes (Signed)
Carelink Summary Report / Loop Recorder 

## 2017-07-08 LAB — CUP PACEART REMOTE DEVICE CHECK
Date Time Interrogation Session: 20181031120823
MDC IDC PG IMPLANT DT: 20160712

## 2017-08-05 ENCOUNTER — Ambulatory Visit (INDEPENDENT_AMBULATORY_CARE_PROVIDER_SITE_OTHER): Payer: Medicare HMO | Admitting: *Deleted

## 2017-08-05 DIAGNOSIS — I639 Cerebral infarction, unspecified: Secondary | ICD-10-CM

## 2017-08-05 NOTE — Progress Notes (Signed)
Carelink Summary Report / Loop Recorder 

## 2017-08-16 LAB — CUP PACEART REMOTE DEVICE CHECK
Date Time Interrogation Session: 20181130124050
Implantable Pulse Generator Implant Date: 20160712

## 2017-09-05 ENCOUNTER — Ambulatory Visit (INDEPENDENT_AMBULATORY_CARE_PROVIDER_SITE_OTHER): Payer: Medicare HMO | Admitting: *Deleted

## 2017-09-05 DIAGNOSIS — I639 Cerebral infarction, unspecified: Secondary | ICD-10-CM

## 2017-09-05 NOTE — Progress Notes (Signed)
Carelink Summary Report / Loop Recorder 

## 2017-09-15 LAB — CUP PACEART REMOTE DEVICE CHECK
Implantable Pulse Generator Implant Date: 20160712
MDC IDC SESS DTM: 20181230131000

## 2017-10-03 ENCOUNTER — Other Ambulatory Visit: Payer: Self-pay | Admitting: Cardiovascular Disease

## 2017-10-04 ENCOUNTER — Ambulatory Visit (INDEPENDENT_AMBULATORY_CARE_PROVIDER_SITE_OTHER): Payer: Medicare HMO | Admitting: *Deleted

## 2017-10-04 DIAGNOSIS — I639 Cerebral infarction, unspecified: Secondary | ICD-10-CM

## 2017-10-04 NOTE — Progress Notes (Signed)
Carelink Summary Report / Loop Recorder 

## 2017-10-18 LAB — CUP PACEART REMOTE DEVICE CHECK
Implantable Pulse Generator Implant Date: 20160712
MDC IDC SESS DTM: 20190129134225

## 2017-10-25 ENCOUNTER — Encounter: Payer: Self-pay | Admitting: Cardiology

## 2017-10-25 ENCOUNTER — Ambulatory Visit: Payer: Medicare HMO | Admitting: Cardiology

## 2017-10-25 VITALS — BP 158/84 | HR 80 | Ht 72.0 in | Wt 244.2 lb

## 2017-10-25 DIAGNOSIS — I10 Essential (primary) hypertension: Secondary | ICD-10-CM | POA: Diagnosis not present

## 2017-10-25 DIAGNOSIS — G4733 Obstructive sleep apnea (adult) (pediatric): Secondary | ICD-10-CM | POA: Diagnosis not present

## 2017-10-25 DIAGNOSIS — Z7901 Long term (current) use of anticoagulants: Secondary | ICD-10-CM

## 2017-10-25 DIAGNOSIS — E7800 Pure hypercholesterolemia, unspecified: Secondary | ICD-10-CM

## 2017-10-25 DIAGNOSIS — J449 Chronic obstructive pulmonary disease, unspecified: Secondary | ICD-10-CM

## 2017-10-25 DIAGNOSIS — I482 Chronic atrial fibrillation: Secondary | ICD-10-CM

## 2017-10-25 DIAGNOSIS — I4821 Permanent atrial fibrillation: Secondary | ICD-10-CM

## 2017-10-25 DIAGNOSIS — Z8673 Personal history of transient ischemic attack (TIA), and cerebral infarction without residual deficits: Secondary | ICD-10-CM

## 2017-10-25 MED ORDER — RIVAROXABAN 20 MG PO TABS: 20.0000 mg | ORAL_TABLET | Freq: Every day | ORAL | 3 refills | Status: AC

## 2017-10-25 MED ORDER — DILTIAZEM HCL ER BEADS 240 MG PO CP24: 240.0000 mg | ORAL_CAPSULE | Freq: Every day | ORAL | 3 refills | Status: AC

## 2017-10-25 MED ORDER — ATORVASTATIN CALCIUM 80 MG PO TABS: 80.0000 mg | ORAL_TABLET | Freq: Every day | ORAL | 3 refills | Status: AC

## 2017-10-25 NOTE — Assessment & Plan Note (Signed)
Followed by PCP

## 2017-10-25 NOTE — Assessment & Plan Note (Signed)
Rate controlled, asymptomatic 

## 2017-10-25 NOTE — Progress Notes (Addendum)
10/25/2017 Miguel [name]   19-May-1951  [id number]  Primary Physician Bosie Clos, MD Primary Cardiologist: Dr Royann Shivers  HPI:  67 y.o. male with a history of stroke related to asymptomatic paroxysmal atrial fibrillation, previous history of atrial flutter status post ablation 2014, COPD, ongoing tobacco use, obstructive sleep apnea not using CPAP, and hyperlipidemia who is here today for follow-up. Previous nterrogation of his loop recorder showed that the atrial fibrillation has become a permanent arrhythmia. Ventricular rate control is fair with average ventricular rates around the 80-90 bpm. He is on Xarelto. His main issue is COPD, he has been unable to quit smoking and still smokes a pack a day.    Current Outpatient Medications  Medication Sig Dispense Refill  . albuterol (PROAIR HFA) 108 (90 BASE) MCG/ACT inhaler Inhale 2 puffs into the lungs every 6 (six) hours as needed. For wheezing    . atorvastatin (LIPITOR) 80 MG tablet Take 1 tablet (80 mg total) by mouth daily. 90 tablet 3  . cetirizine (ZYRTEC) 10 MG tablet Take 10 mg by mouth daily as needed. For allergy symptoms    . Cholecalciferol ([employer]S VIT D 5000 HIGH-POTENCY PO) Take 1 tablet by mouth daily.    . clonazePAM (KLONOPIN) 0.5 MG tablet Take 5 mg by mouth as needed.    . Cyanocobalamin (VITAMIN B 12 PO) Take 1 tablet by mouth daily.    Marland Kitchen diltiazem (TIAZAC) 240 MG 24 hr capsule Take 1 capsule (240 mg total) by mouth daily. 90 capsule 3  . DULoxetine (CYMBALTA) 60 MG capsule Take 60 mg by mouth every morning.     . Fluticasone-Salmeterol (ADVAIR) 250-50 MCG/DOSE AEPB Inhale 1 puff into the lungs 2 (two) times daily.    Marland Kitchen omeprazole (PRILOSEC) 40 MG capsule Take 40 mg by mouth daily.    . rivaroxaban (XARELTO) 20 MG TABS tablet Take 1 tablet (20 mg total) by mouth daily with supper. 90 tablet 3   No current facility-administered medications for this visit.     Allergies  Allergen Reactions  . Aspirin Shortness Of  Breath and Nausea Only  . Tylenol [Acetaminophen] Shortness Of Breath and Nausea Only    Name brand only (causes sob and nausea)  . Lisinopril     Past Medical History:  Diagnosis Date  . Arthritis   . Avascular necrosis of bones of both hips (HCC)    s/p bilateral hip replacement  . Cat allergies    and dogs  . COPD (chronic obstructive pulmonary disease) (HCC)   . Depression   . GERD (gastroesophageal reflux disease)   . Hyperlipidemia   . Hypertension   . OSA (obstructive sleep apnea)   . Paroxysmal atrial flutter (HCC)   . Seasonal allergies     Social History   Socioeconomic History  . Marital status: Married    Spouse name: Not on file  . Number of children: 2  . Years of education: Not on file  . Highest education level: Not on file  Social Needs  . Financial resource strain: Not on file  . Food insecurity - worry: Not on file  . Food insecurity - inability: Not on file  . Transportation needs - medical: Not on file  . Transportation needs - non-medical: Not on file  Occupational History  . Occupation: disability    Comment: painting  Tobacco Use  . Smoking status: Current Every Day Smoker    Packs/day: 0.50    Types: Cigarettes  . Smokeless  tobacco: Never Used  . Tobacco comment: Started smoking at age 67.  Currently smoking 1/2 ppd.  Substance and Sexual Activity  . Alcohol use: Yes    Comment: beer (1-2 cans per day), remote heavy ETOH  . Drug use: No  . Sexual activity: Not on file    Comment: trying to cut down smoking  Other Topics Concern  . 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     Family History  Problem Relation Age of Onset  . Breast cancer Sister   . Emphysema Mother   . Arthritis Mother   . Diabetes Sister   . Cancer Sister   . Diabetes Father   . Asthma Mother      Review of Systems: General: negative for chills, fever, night sweats or weight changes.    Cardiovascular: negative for chest pain, dyspnea on exertion, edema, orthopnea, palpitations, paroxysmal nocturnal dyspnea or shortness of breath Dermatological: negative for rash Respiratory: negative for cough or wheezing Urologic: negative for hematuria Abdominal: negative for nausea, vomiting, diarrhea, bright red blood per rectum, melena, or hematemesis Neurologic: negative for visual changes, syncope, or dizziness All other systems reviewed and are otherwise negative except as noted above.    Blood pressure (!) 158/84, pulse 80, height 6' (1.829 m), weight 244 lb 3.2 oz (110.8 kg).  General appearance: alert, cooperative, no distress, mildly obese and mild SOB at rest Neck: no carotid bruit and no JVD Lungs: clear to auscultation bilaterally and decreased breath sounds Heart: irregularly irregular rhythm Extremities: no edema Skin: Skin color, texture, turgor normal. No rashes or lesions Neurologic: Grossly normal  EKG AF with VR 80  ASSESSMENT AND PLAN:   Permanent atrial fibrillation (HCC) Rate controlled, asymptomatic  History of stroke Uses a cane now  Long term current use of anticoagulant CHADS VASC=3, on Xarelto  High cholesterol Followed by PCP  OSA (obstructive sleep apnea) Not using C-pap  Essential hypertension Controlled  COPD (chronic obstructive pulmonary disease) Gold C Still smoking 1PPD   PLAN  Same cardiac Rx. Will try and get most recent labs from his PCP. We discussed smoking and possible strategies. F/U in one year. He asked about having his loop recorder removed and I told him to just contact us if he wants that done, otherwise it can stay in.   Corine ShelterLuke Niana Martorana PA-C 10/25/2017 10:33 AM   Addendum: Labs reviewed- TSH and LFTs WNL. LDL was 80 three months ago.

## 2017-10-25 NOTE — Patient Instructions (Addendum)
Miguel ShelterLuke [name], [place] wants you to follow-up in: ONE YEAR with Dr. Royann Shiversroitoru. You will receive a reminder letter in the mail two months in advance. If you don't receive a letter, please call our office to schedule the follow-up appointment.  Please contact our office if you would like to have your loop recorder removed.

## 2017-10-25 NOTE — Assessment & Plan Note (Signed)
Not using C-pap 

## 2017-10-25 NOTE — Assessment & Plan Note (Signed)
Uses a cane now

## 2017-10-25 NOTE — Assessment & Plan Note (Signed)
Controlled.  

## 2017-10-25 NOTE — Assessment & Plan Note (Signed)
CHADS VASC=3, on Xarelto 

## 2017-10-25 NOTE — Assessment & Plan Note (Signed)
Still smoking < 1 PPD 

## 2017-11-07 ENCOUNTER — Ambulatory Visit (INDEPENDENT_AMBULATORY_CARE_PROVIDER_SITE_OTHER): Payer: Medicare HMO | Admitting: *Deleted

## 2017-11-07 DIAGNOSIS — I639 Cerebral infarction, unspecified: Secondary | ICD-10-CM | POA: Diagnosis not present

## 2017-11-07 NOTE — Progress Notes (Signed)
Carelink Summary Report / Loop Recorder 

## 2017-12-09 ENCOUNTER — Ambulatory Visit (INDEPENDENT_AMBULATORY_CARE_PROVIDER_SITE_OTHER): Payer: Medicare HMO | Admitting: *Deleted

## 2017-12-09 DIAGNOSIS — I639 Cerebral infarction, unspecified: Secondary | ICD-10-CM

## 2017-12-12 NOTE — Progress Notes (Signed)
Carelink Summary Report / Loop Recorder 

## 2017-12-20 LAB — CUP PACEART REMOTE DEVICE CHECK
Implantable Pulse Generator Implant Date: 20160712
MDC IDC SESS DTM: 20190303133618

## 2018-01-11 ENCOUNTER — Ambulatory Visit (INDEPENDENT_AMBULATORY_CARE_PROVIDER_SITE_OTHER): Payer: Medicare HMO | Admitting: *Deleted

## 2018-01-11 DIAGNOSIS — I639 Cerebral infarction, unspecified: Secondary | ICD-10-CM | POA: Diagnosis not present

## 2018-01-11 LAB — CUP PACEART REMOTE DEVICE CHECK
Date Time Interrogation Session: 20190405133755
MDC IDC PG IMPLANT DT: 20160712

## 2018-01-11 NOTE — Progress Notes (Signed)
Carelink Summary Report / Loop Recorder 

## 2018-02-06 LAB — CUP PACEART REMOTE DEVICE CHECK
MDC IDC PG IMPLANT DT: 20160712
MDC IDC SESS DTM: 20190508140711

## 2018-02-13 ENCOUNTER — Ambulatory Visit (INDEPENDENT_AMBULATORY_CARE_PROVIDER_SITE_OTHER): Payer: Medicare HMO | Admitting: *Deleted

## 2018-02-13 DIAGNOSIS — I639 Cerebral infarction, unspecified: Secondary | ICD-10-CM | POA: Diagnosis not present

## 2018-02-13 NOTE — Progress Notes (Signed)
Carelink Summary Report / Loop Recorder 

## 2018-03-20 ENCOUNTER — Ambulatory Visit (INDEPENDENT_AMBULATORY_CARE_PROVIDER_SITE_OTHER): Payer: Medicare HMO | Admitting: *Deleted

## 2018-03-20 DIAGNOSIS — I639 Cerebral infarction, unspecified: Secondary | ICD-10-CM | POA: Diagnosis not present

## 2018-03-20 NOTE — Progress Notes (Signed)
Carelink Summary Report / Loop Recorder 

## 2018-03-21 LAB — CUP PACEART REMOTE DEVICE CHECK
Implantable Pulse Generator Implant Date: 20160712
MDC IDC SESS DTM: 20190610164030

## 2018-04-20 ENCOUNTER — Ambulatory Visit (INDEPENDENT_AMBULATORY_CARE_PROVIDER_SITE_OTHER): Payer: Medicare HMO | Admitting: *Deleted

## 2018-04-20 DIAGNOSIS — I10 Essential (primary) hypertension: Secondary | ICD-10-CM

## 2018-04-20 DIAGNOSIS — I639 Cerebral infarction, unspecified: Secondary | ICD-10-CM | POA: Diagnosis not present

## 2018-04-20 NOTE — Progress Notes (Signed)
Carelink Summary Report / Loop Recorder 

## 2018-05-08 LAB — CUP PACEART REMOTE DEVICE CHECK
Date Time Interrogation Session: 20190713163636
MDC IDC PG IMPLANT DT: 20160712

## 2018-05-23 ENCOUNTER — Ambulatory Visit (INDEPENDENT_AMBULATORY_CARE_PROVIDER_SITE_OTHER): Payer: Medicare HMO | Admitting: *Deleted

## 2018-05-23 DIAGNOSIS — I639 Cerebral infarction, unspecified: Secondary | ICD-10-CM | POA: Diagnosis not present

## 2018-05-24 NOTE — Progress Notes (Signed)
Carelink Summary Report / Loop Recorder 

## 2018-05-29 LAB — CUP PACEART REMOTE DEVICE CHECK
Implantable Pulse Generator Implant Date: 20160712
MDC IDC SESS DTM: 20190815164213

## 2018-06-04 LAB — CUP PACEART REMOTE DEVICE CHECK
MDC IDC PG IMPLANT DT: 20160712
MDC IDC SESS DTM: 20190917180729

## 2018-06-06 ENCOUNTER — Telehealth: Payer: Self-pay | Admitting: *Deleted

## 2018-06-06 NOTE — Telephone Encounter (Signed)
Attempted to reach patient on cell and home phone regarding LINQ at RRT since 06/02/18. Mailboxes are full. No answer.

## 2018-06-23 NOTE — Telephone Encounter (Signed)
Attempted to reach patient on cell and home phones regarding LINQ at R[facility]. Home home rang continuously. No answer or mailbox available on cell phone.

## 2018-06-26 ENCOUNTER — Ambulatory Visit (INDEPENDENT_AMBULATORY_CARE_PROVIDER_SITE_OTHER): Payer: Medicare HMO | Admitting: *Deleted

## 2018-06-26 DIAGNOSIS — I639 Cerebral infarction, unspecified: Secondary | ICD-10-CM | POA: Diagnosis not present

## 2018-06-26 NOTE — Progress Notes (Signed)
Carelink Summary Report / Loop Recorder 

## 2018-06-29 NOTE — Telephone Encounter (Signed)
Attempted to reach patient on cell and home phones regarding LINQ at R[facility]. Home home rang continuously. No answer or mailbox available on cell phone.  

## 2018-07-13 ENCOUNTER — Telehealth: Payer: Self-pay

## 2018-07-13 NOTE — Telephone Encounter (Signed)
LMOVM requesting that pt send manual transmission b/c home monitor has not updated in at least 14 days.    

## 2018-07-14 LAB — CUP PACEART REMOTE DEVICE CHECK
Date Time Interrogation Session: 20191020181130
Implantable Pulse Generator Implant Date: 20160712

## 2018-07-19 ENCOUNTER — Encounter: Payer: Self-pay | Admitting: Cardiology

## 2018-07-21 NOTE — Telephone Encounter (Signed)
Attempted to reach patient on home phone regarding LINQ at RRT. Home phone rang continuously.

## 2018-07-28 ENCOUNTER — Ambulatory Visit (INDEPENDENT_AMBULATORY_CARE_PROVIDER_SITE_OTHER): Payer: Medicare HMO

## 2018-07-28 DIAGNOSIS — I639 Cerebral infarction, unspecified: Secondary | ICD-10-CM | POA: Diagnosis not present

## 2018-07-31 NOTE — Progress Notes (Signed)
Carelink Summary Report / Loop Recorder 

## 2018-08-02 ENCOUNTER — Encounter: Payer: Self-pay | Admitting: *Deleted

## 2018-08-02 NOTE — Progress Notes (Signed)
Letter sent to patient in regards to LINQ at RRT.

## 2018-09-05 ENCOUNTER — Other Ambulatory Visit: Payer: Self-pay | Admitting: Cardiovascular Disease

## 2018-09-17 LAB — CUP PACEART REMOTE DEVICE CHECK
Date Time Interrogation Session: 20191122184017
MDC IDC PG IMPLANT DT: 20160712

## 2018-10-13 ENCOUNTER — Encounter: Payer: Self-pay | Admitting: Cardiovascular Disease

## 2018-10-13 ENCOUNTER — Ambulatory Visit (INDEPENDENT_AMBULATORY_CARE_PROVIDER_SITE_OTHER): Payer: Medicare Other | Admitting: Cardiovascular Disease

## 2018-10-13 VITALS — BP 144/72 | HR 81 | Ht 72.0 in | Wt 258.5 lb

## 2018-10-13 DIAGNOSIS — Z6835 Body mass index (BMI) 35.0-35.9, adult: Secondary | ICD-10-CM

## 2018-10-13 DIAGNOSIS — E7800 Pure hypercholesterolemia, unspecified: Secondary | ICD-10-CM

## 2018-10-13 DIAGNOSIS — J449 Chronic obstructive pulmonary disease, unspecified: Secondary | ICD-10-CM

## 2018-10-13 DIAGNOSIS — I4821 Permanent atrial fibrillation: Secondary | ICD-10-CM | POA: Diagnosis not present

## 2018-10-13 DIAGNOSIS — I639 Cerebral infarction, unspecified: Secondary | ICD-10-CM

## 2018-10-13 DIAGNOSIS — Z4509 Encounter for adjustment and management of other cardiac device: Secondary | ICD-10-CM

## 2018-10-13 DIAGNOSIS — G4733 Obstructive sleep apnea (adult) (pediatric): Secondary | ICD-10-CM | POA: Diagnosis not present

## 2018-10-13 DIAGNOSIS — I10 Essential (primary) hypertension: Secondary | ICD-10-CM

## 2018-10-13 DIAGNOSIS — Z720 Tobacco use: Secondary | ICD-10-CM

## 2018-10-13 DIAGNOSIS — Z7901 Long term (current) use of anticoagulants: Secondary | ICD-10-CM

## 2018-10-13 NOTE — Patient Instructions (Signed)
Medication Instructions:  The current medical regimen is effective;  continue present plan and medications.  If you need a refill on your cardiac medications before your next appointment, please call your pharmacy.    Follow-Up: At [facility], you and your health needs are our priority.  As part of our continuing mission to provide you with exceptional heart care, we have created designated Provider Care Teams.  These Care Teams include your primary Cardiologist (physician) and Advanced Practice Providers (APPs -  Physician Assistants and Nurse Practitioners) who all work together to provide you with the care you need, when you need it. You will need a follow up appointment in 12 months.  Please call our office 2 months in advance to schedule this appointment.  You may see Dr.Croitoru or one of the following Advanced Practice Providers on your designated Care Team: Hao Meng, PA-C . Angela Duke, PA-C     

## 2018-10-13 NOTE — Progress Notes (Signed)
Patient ID: Miguel [name], male   DOB: 1951/02/14, 68 y.o.   MRN: [id number]    Cardiology Office Note    Date:  10/15/2018   ID:  Miguel [name], DOB 1950/12/29, MRN [id number]  PCP:  Bosie Clos, MD  Cardiologist:   Thurmon Fair, MD   Chief Complaint  Patient presents with  . Atrial Fibrillation    History of Present Illness:  Miguel [name] is a 68 y.o. male with a history of stroke related to asymptomatic paroxysmal atrial fibrillation, previous history of atrial flutter status post ablation, COPD, ongoing tobacco use, obstructive sleep apnea not using CPAP, hyperlipidemia returning for follow-up.  He generally doing well from a cardiovascular point of view.  He does complain of occasional dizziness and occasional headaches.  Unfortunately continues to smoke about three quarters of a pack of cigarettes a day.  His PCP is Dr. Dimple Casey with [facility].  He usually has his labs performed at her office and was told that his most recent lipid profile was "good".  He remains quite sedentary.  He has not had any recent falls the patient specifically denies any chest pain at rest exertion, dyspnea at rest or with exertion, orthopnea, paroxysmal nocturnal dyspnea, syncope, palpitations, focal neurological deficits, intermittent claudication, lower extremity edema, unexplained weight gain, hemoptysis or wheezing, but he does have a chronic cough.  He denies any bleeding complications or serious injuries he is compliant with the anticoagulant.  His loop recorder has reached end of service and is not transmitting anymore.  Past Medical History:  Diagnosis Date  . Arthritis   . Avascular necrosis of bones of both hips (HCC)    s/p bilateral hip replacement  . Cat allergies    and dogs  . COPD (chronic obstructive pulmonary disease) (HCC)   . Depression   . GERD (gastroesophageal reflux disease)   . Hyperlipidemia   . Hypertension   . OSA (obstructive sleep apnea)   . Paroxysmal  atrial flutter (HCC)   . Seasonal allergies     Past Surgical History:  Procedure Laterality Date  . APPENDECTOMY    . ATRIAL FLUTTER ABLATION  11/14/12   CTI ablation by Dr Johney Frame  . ATRIAL FLUTTER ABLATION N/A 11/14/2012   Procedure: ATRIAL FLUTTER ABLATION;  Surgeon: Hillis Range, MD;  Location: [facility] CATH LAB;  Service: Cardiovascular;  Laterality: N/A;  . EP IMPLANTABLE DEVICE N/A 03/18/2015   Procedure: Loop Recorder Insertion;  Surgeon: Thurmon Fair, MD;  Location: [facility] INVASIVE CV LAB;  Service: Cardiovascular;  Laterality: N/A;  . HERNIA REPAIR     x3  . right hand surgery    . SINUS SURGERY WITH INSTATRAK     x3  . TONSILLECTOMY    . TOTAL HIP ARTHROPLASTY     right and left  . WRIST ARTHROSCOPY WITH DEBRIDEMENT  09/27/2012   Procedure: WRIST ARTHROSCOPY WITH DEBRIDEMENT;  Surgeon: Sharma Covert, MD;  Location: [facility];  Service: Orthopedics;  Laterality: Left;  Left Wrist Arthroscopy and Debridement of Triangular Cartilege Complex Tear and Lunotriquetral ligament,  Exploration of Sixth Dorsal Compartment and Repair    Current Medications: Outpatient Medications Prior to Visit  Medication Sig Dispense Refill  . albuterol (PROAIR HFA) 108 (90 BASE) MCG/ACT inhaler Inhale 2 puffs into the lungs every 6 (six) hours as needed. For wheezing    . atorvastatin (LIPITOR) 80 MG tablet Take 1 tablet (80 mg total) by mouth daily. 90 tablet 3  . cetirizine (  ZYRTEC) 10 MG tablet Take 10 mg by mouth daily as needed. For allergy symptoms    . Cholecalciferol (CVS VIT D 5000 HIGH-POTENCY PO) Take 1 tablet by mouth daily.    . clonazePAM (KLONOPIN) 0.5 MG tablet Take 5 mg by mouth as needed.    . Cyanocobalamin (VITAMIN B 12 PO) Take 1 tablet by mouth daily.    Marland Kitchen. diltiazem (TIAZAC) 240 MG 24 hr capsule Take 1 capsule (240 mg total) by mouth daily. 90 capsule 3  . DULoxetine (CYMBALTA) 60 MG capsule Take 60 mg by mouth every morning.     . Fluticasone-Umeclidin-Vilant  100-62.5-25 MCG/INH AEPB Inhale 1 puff into the lungs daily.    Marland Kitchen. omeprazole (PRILOSEC) 40 MG capsule Take 40 mg by mouth daily.    . rivaroxaban (XARELTO) 20 MG TABS tablet Take 1 tablet (20 mg total) by mouth daily with supper. 90 tablet 3  . Fluticasone-Salmeterol (ADVAIR) 250-50 MCG/DOSE AEPB Inhale 1 puff into the lungs 2 (two) times daily.     No facility-administered medications prior to visit.      Allergies:   Aspirin; Tylenol [acetaminophen]; and Lisinopril   Social History   Socioeconomic History  . Marital status: Married    Spouse name: Not on file  . Number of children: 2  . Years of education: Not on file  . Highest education level: Not on file  Occupational History  . Occupation: disability    Comment: painting  Social Needs  . Financial resource strain: Not on file  . Food insecurity:    Worry: Not on file    Inability: Not on file  . Transportation needs:    Medical: Not on file    Non-medical: Not on file  Tobacco Use  . Smoking status: Current Every Day Smoker    Packs/day: 0.50    Types: Cigarettes  . Smokeless tobacco: Never Used  . Tobacco comment: Started smoking at age 68.  Currently smoking 1/2 ppd.  Substance and Sexual Activity  . Alcohol use: Yes    Comment: beer (1-2 cans per day), remote heavy ETOH  . Drug use: No  . Sexual activity: Not on file    Comment: trying to cut down smoking  Lifestyle  . Physical activity:    Days per week: Not on file    Minutes per session: Not on file  . Stress: Not on file  Relationships  . Social connections:    Talks on phone: Not on file    Gets together: Not on file    Attends religious service: Not on file    Active member of club or organization: Not on file    Attends meetings of clubs or organizations: Not on file    Relationship status: Not on file  Other Topics Concern  . 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      Family History:  The patient's family history includes Arthritis in his mother; Asthma in his mother; Breast cancer in his sister; Cancer in his sister; Diabetes in his father and sister; Emphysema in his mother.   ROS:   Please see the history of present illness.    ROS all other systems are reviewed and are negative   PHYSICAL EXAM:   VS:  BP (!) 144/72   Pulse 81   Ht 6' (1.829 m)   Wt 258 lb 8 oz (117.3 kg)  BMI 35.06 kg/m    GEN: Well nourished, well developed, in no acute distress  HEENT: normal  Neck: no JVD, carotid bruits, or masses Cardiac: Irregular rhythm; no murmurs, rubs, or gallops,no edema  Respiratory: Emphysematous chest, a few scattered rhonchi, no wheezes, otherwise clear to auscultation bilaterally, normal work of breathing GI: soft, nontender, nondistended, + BS MS: no deformity or atrophy  Skin: warm and dry, no rash Neuro:  Alert and Oriented x 3, Strength and sensation are intact Psych: euthymic mood, full affect  Wt Readings from Last 3 Encounters:  10/13/18 258 lb (117 kg)  10/25/17 244 lb 3.2 oz (110.8 kg)  09/29/16 253 lb 12.8 oz (115.1 kg)      Studies/Labs Reviewed:   EKG:  EKG is ordered today.  Atrial fibrillation, rate control, relatively low voltage, nonspecific T wave changes, QTC 441 ms  ASSESSMENT:    1. Permanent atrial fibrillation   2. Long term (current) use of anticoagulants   3. Essential hypertension   4. OSA (obstructive sleep apnea)   5. Severe obesity (BMI 35.0-35.9 with comorbidity) (HCC)   6. Hypercholesterolemia   7. Chronic obstructive pulmonary disease, unspecified COPD type (HCC)   8. Tobacco abuse   9. Encounter for loop recorder at end of battery life      PLAN:  In order of problems listed above:  1. AFib: Permanent, asymptomatic, well rate controlled. Continue uninterrupted anticoagulation. CHADSVasc at least 3 (CVA 2, HTN).  2. Xarelto: Well-tolerated without bleeding complications.   3. HTN:  Slightly elevated today, usually in the 120s.  4. OSA: Intolerant of treatment 5. Weight he has gained back all the weight that he lost last year.  He seems to lack any motivation, but I did my best to try to convince him about the importance of caloric restriction and regular exercise. 6. HLP: Most recent lipid profile that I have available for review was excellent. 7. COPD: He already has evidence of severe COPD.  Strongly encouraged him to stop smoking.  His wife is also trying to convince him to quit smoking. 8. Tobacco abuse: smoking cessation discussed in detail. 9. ILR: Discussed how to manage this device now that it has reached from the service.  He has no desire to remove it at this time.    Medication Adjustments/Labs and Tests Ordered: Current medicines are reviewed at length with the patient today.  Concerns regarding medicines are outlined above.  Medication changes, Labs and Tests ordered today are listed in the Patient Instructions below. Patient Instructions  Medication Instructions:  The current medical regimen is effective;  continue present plan and medications.  If you need a refill on your cardiac medications before your next appointment, please call your pharmacy.    Follow-Up: At [facility], you and your health needs are our priority.  As part of our continuing mission to provide you with exceptional heart care, we have created designated Provider Care Teams.  These Care Teams include your primary Cardiologist (physician) and Advanced Practice Providers (APPs -  Physician Assistants and Nurse Practitioners) who all work together to provide you with the care you need, when you need it. You will need a follow up appointment in 12 months.  Please call our office 2 months in advance to schedule this appointment.  You may see Dr.Samyukta Cura or one of the following Advanced Practice Providers on your designated Care Team: Azalee Course, [place] . Micah Flesher, PA-C         Signed, Rachelle Hora  Koral Thaden, MD  10/15/2018 11:51 AM    [facility] [address] [place], [place], [place]  [address] Phone: [phone]; Fax: [phone]

## 2018-10-15 ENCOUNTER — Encounter: Payer: Self-pay | Admitting: Cardiovascular Disease

## 2023-08-30 ENCOUNTER — Encounter: Payer: Self-pay | Admitting: *Deleted

## 2023-08-30 ENCOUNTER — Other Ambulatory Visit: Payer: Self-pay | Admitting: *Deleted

## 2023-08-30 DIAGNOSIS — K6389 Other specified diseases of intestine: Secondary | ICD-10-CM

## 2023-08-30 NOTE — Progress Notes (Signed)
Reviewed this referral with Dr Myna Hidalgo. He would like patient urgently referred to GI for colonoscopy and biopsies. Urgent referral placed.   Oncology Nurse Navigator Documentation     08/30/2023   12:00 PM  Oncology Nurse Navigator Flowsheets  Navigator Location [place]  Referral Date to RadOnc/MedOnc 08/29/2023  Navigator Encounter Type Other:  Patient Visit Type MedOnc  Treatment Phase Abnormal Scans  Barriers/Navigation Needs Coordination of Care  Interventions Referrals  Acuity Level 2-Minimal Needs (1-2 Barriers Identified)  Referrals Other  Time Spent with Patient 15

## 2023-09-02 ENCOUNTER — Encounter: Payer: Self-pay | Admitting: *Deleted

## 2023-09-02 NOTE — Progress Notes (Unsigned)
Patient was seen on 09/01/2023 by a GI MD in [facility]. Plan is as follows:  -I told him we have to r/o colon ca and he needs a colonoscopy. He is high risk given his comorbid conditions, will need to be done at [facility] possibly with intubation pending anesthesia review. We need clearance to hold Xraelto x 2-3 days prior to scheduling and will fax a request to prescribing provider.   Attempted to call patient on 09/02/2023 at 12:30p. 2:00p and 3:25p. All three time no contact was made and no voicemail.   Called patient and notified him that his new patient appointment would be scheduled after his colonoscopy. Explained that I will follow for scheduling and once colonoscopy is scheduled I will call back and schedule appointment with Dr Myna Hidalgo. He was in understanding.

## 2023-09-09 ENCOUNTER — Encounter: Payer: Self-pay | Admitting: *Deleted

## 2023-09-09 NOTE — Progress Notes (Signed)
 Patient is scheduled for colonoscopy on 09/13/2023.   Oncology Nurse Navigator Documentation     09/09/2023    2:00 PM  Oncology Nurse Navigator Flowsheets  Navigator Follow Up Date: 09/13/2023  Navigator Follow Up Reason: Other:  Navigator Location [facility]  Navigator Encounter Type Appt/Treatment Plan Review  Patient Visit Type MedOnc  Treatment Phase Abnormal Scans  Barriers/Navigation Needs Coordination of Care  Interventions None Required  Acuity Level 2-Minimal Needs (1-2 Barriers Identified)  Support Groups/Services Friends and Family  Time Spent with Patient 15

## 2023-09-13 ENCOUNTER — Encounter: Payer: Self-pay | Admitting: *Deleted

## 2023-09-13 NOTE — Progress Notes (Signed)
 Patient had his colonoscopy today with biopsy. Will follow for path.   Oncology Nurse Navigator Documentation     09/13/2023   10:15 AM  Oncology Nurse Navigator Flowsheets  Navigator Follow Up Date: 09/16/2023  Navigator Follow Up Reason: Pathology  Navigator Location [facility]  Navigator Encounter Type Appt/Treatment Plan Review  Patient Visit Type MedOnc  Treatment Phase Abnormal Scans  Barriers/Navigation Needs Coordination of Care  Interventions None Required  Acuity Level 2-Minimal Needs (1-2 Barriers Identified)  Support Groups/Services Friends and Family  Time Spent with Patient 15

## 2023-09-16 ENCOUNTER — Encounter: Payer: Self-pay | Admitting: *Deleted

## 2023-09-16 NOTE — Progress Notes (Signed)
 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 Called patient to schedule his new patient appointment.   Spoke to patient's wife, Dorothyann. She states that patient has an appointment with a surgeon to consult for upfront surgery and at this time she is unsure patient will need systemic therapy. She asks that we close referral for now, and depending on patient's future needs, she will reach back out. She has all my contact info.   Will close referral and discontinue navigation at this time but be available to the patient as needed in the future.   Oncology Nurse Navigator Documentation     09/16/2023   11:00 AM  Oncology Nurse Navigator Flowsheets  Confirmed Diagnosis Date 09/13/2023  Navigation Complete Date: 09/16/2023  Post Navigation: Continue to Follow Patient? No  Reason Not Navigating Patient: Other:  Financial Risk Analyst Encounter Type Pathology Review;Telephone  Telephone Outgoing Call  Patient Visit Type MedOnc  Treatment Phase Pre-Tx/Tx Discussion  Barriers/Navigation Needs Coordination of Care  Interventions Education  Acuity Level 2-Minimal Needs (1-2 Barriers Identified)  Education Method Verbal  Support Groups/Services Friends and Family  Time Spent with Patient 30
